# Patient Record
Sex: Male | Born: 2000 | Race: White | Hispanic: No | Marital: Single | Smoking: Never smoker
Health system: Southern US, Community
[De-identification: ages and names within clinical notes are randomized; demographics above are authoritative.]

## PROBLEM LIST (undated history)

## (undated) DIAGNOSIS — Z9109 Other allergy status, other than to drugs and biological substances: Secondary | ICD-10-CM

## (undated) DIAGNOSIS — J4540 Moderate persistent asthma, uncomplicated: Secondary | ICD-10-CM

## (undated) DIAGNOSIS — K219 Gastro-esophageal reflux disease without esophagitis: Secondary | ICD-10-CM

## (undated) DIAGNOSIS — T7840XA Allergy, unspecified, initial encounter: Secondary | ICD-10-CM

## (undated) DIAGNOSIS — F32A Depression, unspecified: Secondary | ICD-10-CM

## (undated) DIAGNOSIS — R51 Headache: Secondary | ICD-10-CM

## (undated) HISTORY — DX: Depression, unspecified: F32.A

## (undated) HISTORY — DX: Gastro-esophageal reflux disease without esophagitis: K21.9

## (undated) HISTORY — DX: Moderate persistent asthma, uncomplicated: J45.40

## (undated) HISTORY — PX: TYMPANOSTOMY: SHX2586

---
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
## 2019-05-07 ENCOUNTER — Other Ambulatory Visit: Payer: Self-pay

## 2019-05-07 ENCOUNTER — Encounter: Payer: Self-pay | Admitting: Allergy & Immunology

## 2019-05-07 ENCOUNTER — Ambulatory Visit (INDEPENDENT_AMBULATORY_CARE_PROVIDER_SITE_OTHER): Payer: BC Managed Care – PPO | Admitting: Allergy & Immunology

## 2019-05-07 VITALS — BP 120/70 | HR 80 | Temp 97.2°F | Resp 18 | Ht 68.0 in | Wt 179.8 lb

## 2019-05-07 DIAGNOSIS — J3089 Other allergic rhinitis: Secondary | ICD-10-CM | POA: Diagnosis not present

## 2019-05-07 DIAGNOSIS — J4540 Moderate persistent asthma, uncomplicated: Secondary | ICD-10-CM | POA: Diagnosis not present

## 2019-05-07 DIAGNOSIS — K219 Gastro-esophageal reflux disease without esophagitis: Secondary | ICD-10-CM

## 2019-05-07 DIAGNOSIS — Z91010 Allergy to peanuts: Secondary | ICD-10-CM | POA: Diagnosis not present

## 2019-05-07 DIAGNOSIS — J302 Other seasonal allergic rhinitis: Secondary | ICD-10-CM

## 2019-05-07 MED ORDER — ALBUTEROL SULFATE HFA 108 (90 BASE) MCG/ACT IN AERS: 2.0000 | INHALATION_SPRAY | RESPIRATORY_TRACT | 2 refills | Status: DC | PRN

## 2019-05-07 MED ORDER — OMEPRAZOLE 40 MG PO CPDR: 40.0000 mg | DELAYED_RELEASE_CAPSULE | Freq: Every day | ORAL | 5 refills | Status: DC

## 2019-05-07 MED ORDER — ADVAIR HFA 115-21 MCG/ACT IN AERO: 2.0000 | INHALATION_SPRAY | Freq: Two times a day (BID) | RESPIRATORY_TRACT | 5 refills | Status: DC

## 2019-05-07 NOTE — Progress Notes (Signed)
FOLLOW UP  Date of Service/Encounter:  05/07/19   Assessment:   Moderate persistent asthma, uncomplicated  Allergic rhinitis (grasses, weeds, trees, molds, dust mite, cat, dog, and horse)  Anaphylaxis to foods (peanuts, tree nuts)  Gastroesophageal reflux disease - on a PPI   Plan/Recommendations:   1. Moderate persistent asthma, uncomplicated - Lung function looked good today and it improved even more with albuterol treatment. - Start the prednisone dose pack provided to control inflammation. - We will continue with the Advair since this seems to be working well.  - Daily controller medication(s): Advair 115/1921mcg two puffs twice daily with spacer - Prior to physical activity: ProAir 2 puffs 10-15 minutes before physical activity. - Rescue medications: ProAir 4 puffs every 4-6 hours as needed - Asthma control goals:  * Full participation in all desired activities (may need albuterol before activity) * Albuterol use two time or less a week on average (not counting use with activity) * Cough interfering with sleep two time or less a month * Oral steroids no more than once a year * No hospitalizations  2. Allergic rhinitis - Consider taking nasal saline rinses daily to help prevent nose bleeds.   3. GERD - Continue with omeprazole 40mg  daily as needed.   4. Anaphylaxis to peanuts/tree nuts - EpiPen up to date. - Continue to avoid peanuts. - Could consider retesting at the next visit.   5. Return in about 6 months (around 11/07/2019). This can be an in-person, a virtual Webex or a telephone follow up visit.   Subjective:   Todd Garcia is a 18 y.o. male presenting today for follow up of  Chief Complaint  Patient presents with  . Asthma    Todd Garcia RoughHicks has a history of the following: Patient Active Problem List   Diagnosis Date Noted  . Moderate persistent asthma, uncomplicated 03/07/2018  . Seasonal and perennial allergic rhinitis 03/07/2018  . Sinus  infection 02/09/2016  . Asthma with acute exacerbation 01/13/2016  . Seasonal allergic conjunctivitis 01/13/2016  . Severe persistent asthma 05/29/2015  . Allergic rhinitis 05/29/2015  . GERD (gastroesophageal reflux disease) 05/29/2015    History obtained from: chart review and patient.  Todd Garcia is a 18 y.o. male presenting for a sick visit.  He was last seen in June 2019 for a well visit.  He tends to only come in during sick visits, however.  We did continue him on Breo 200/25 mcg 1 puff once daily.  We did provide him with additional samples at the last visit.  His lung function looked good. For his allergic rhinitis, we continued with montelukast 10 mg daily as well as Xyzal 5 mg daily.  We also continued fluticasone.  Since last visit, he has mostly done well. He comes today for asthma symptoms of two days duration. He has not been febrile at all during this time. He denies any COVID symptoms.   Asthma Symptom History: We had started him on Breo in the past but apparently he was changed to Advair at some point (the high dose MDI 230/21). He tells me that this is working fairly well, but he has been out of the medications for around six months.   Allergic Rhinitis Symptom History: He is no longer on the cetirizine or the nasal spray. He is no longer taking the montelukast either. He tells me that his symptoms overall have become much more manageable even without the use of medications. His last testing was performed in January 2009  and was positive to grasses, weeds, trees, molds, dust mite, cat, dog, and horse.   Food Allergy Symptom History: He continues to avoid peanuts and tree nuts. He has had no accidental ingestions. He does tell me that he has an up  to date EpiPen. He is not interested in retesting at this time.    GERD Symptom History: He does have omeprazole on his list although he tells me that he does not take it on a regular basis.   Otherwise, there have been no changes to  his past medical history, surgical history, family history, or social history.    Review of Systems  Constitutional: Negative.  Negative for fever, malaise/fatigue and weight loss.  HENT: Negative.  Negative for congestion, ear discharge and ear pain.   Eyes: Negative for pain, discharge and redness.  Respiratory: Positive for cough, shortness of breath and wheezing. Negative for sputum production.   Cardiovascular: Negative.  Negative for chest pain and palpitations.  Gastrointestinal: Negative for abdominal pain and heartburn.  Skin: Negative.  Negative for itching and rash.  Neurological: Negative for dizziness and headaches.  Endo/Heme/Allergies: Negative for environmental allergies. Does not bruise/bleed easily.       Objective:   Blood pressure 120/70, pulse 80, temperature (!) 97.2 F (36.2 C), resp. rate 18, height 5\' 8"  (1.727 m), weight 179 lb 12.8 oz (81.6 kg), SpO2 97 %. Body mass index is 27.34 kg/m.   Physical Exam:  Physical Exam  Constitutional: He appears well-developed.  Very pleasant male. Cooperative with the exam. Talking in complete sentences.   HENT:  Head: Normocephalic and atraumatic.  Right Ear: Tympanic membrane, external ear and ear canal normal.  Left Ear: Tympanic membrane, external ear and ear canal normal.  Nose: Mucosal edema present. No rhinorrhea, nasal deformity or septal deviation. Epistaxis is observed. Right sinus exhibits no maxillary sinus tenderness and no frontal sinus tenderness. Left sinus exhibits no maxillary sinus tenderness and no frontal sinus tenderness.  Mouth/Throat: Uvula is midline and oropharynx is clear and moist. Mucous membranes are not pale and not dry.  Eyes: Pupils are equal, round, and reactive to light. Conjunctivae and EOM are normal. Right eye exhibits no chemosis and no discharge. Left eye exhibits no chemosis and no discharge. Right conjunctiva is not injected. Left conjunctiva is not injected.  Cardiovascular:  Normal rate, regular rhythm and normal heart sounds.  Respiratory: Effort normal and breath sounds normal. No accessory muscle usage. No tachypnea. No respiratory distress. He has no wheezes. He has no rhonchi. He has no rales. He exhibits no tenderness.  Somewhat decreased air movement at the bases.   Lymphadenopathy:    He has no cervical adenopathy.  Neurological: He is alert.  Skin: No abrasion, no petechiae and no rash noted. Rash is not papular, not vesicular and not urticarial. No erythema. No pallor.  No eczematous or urticarial lesions noted.   Psychiatric: He has a normal mood and affect.     Diagnostic studies:    Spirometry: results normal (FEV1: 4.12/98%, FVC: 5.24/106%, FEV1/FVC: 79%).    Spirometry consistent with normal pattern. Albuterol 4 puffs treatment given in clinic with improvement in FEV1 and FVC, but not significant per ATS criteria.  Allergy Studies: none           Salvatore Marvel, MD  [facility]

## 2019-05-07 NOTE — Patient Instructions (Addendum)
1. Moderate persistent asthma, uncomplicated - Lung function looked good today and it improved even more with albuterol treatment. - Start the prednisone dose pack provided to control inflammation. - We will continue with the Advair since this seems to be working well.  - Daily controller medication(s): Advair 115/27mcg two puffs twice daily with spacer - Prior to physical activity: ProAir 2 puffs 10-15 minutes before physical activity. - Rescue medications: ProAir 4 puffs every 4-6 hours as needed - Asthma control goals:  * Full participation in all desired activities (may need albuterol before activity) * Albuterol use two time or less a week on average (not counting use with activity) * Cough interfering with sleep two time or less a month * Oral steroids no more than once a year * No hospitalizations  2. Allergic rhinitis - Consider taking nasal saline rinses daily to help prevent nose bleeds.   3. GERD - Continue with omeprazole 40mg  daily as needed.   4. Anaphylaxis to peanuts/tree nuts - EpiPen up to date. - Continue to avoid peanuts. - Could consider retesting at the next visit.   5. Return in about 6 months (around 11/07/2019). This can be an in-person, a virtual Webex or a telephone follow up visit.   Please inform us of any Emergency Department visits, hospitalizations, or changes in symptoms. Call us before going to the ED for breathing or allergy symptoms since we might be able to fit you in for a sick visit. Feel free to contact us anytime with any questions, problems, or concerns.  It was a pleasure to see you again today!  Websites that have reliable patient information: 1. American Academy of Asthma, Allergy, and Immunology: www.aaaai.org 2. Food Allergy Research and Education (FARE): foodallergy.org 3. Mothers of Asthmatics: http://www.asthmacommunitynetwork.org 4. American College of Allergy, Asthma, and Immunology: www.acaai.org  "Like" [place] on Facebook and  Instagram for our latest updates!      Make sure you are registered to vote! If you have moved or changed any of your contact information, you will need to get this updated before voting!  In some cases, you MAY be able to register to vote online: [email or web address]    Voter ID laws are NOT going into effect for the General Election in November 2020! DO NOT let this stop you from exercising your right to vote!   Absentee voting is the SAFEST way to vote during the coronavirus pandemic!   Download and print an absentee ballot request form at [email or web address] or you can scan the QR code below with your smart phone:      More information on absentee ballots can be found here: [email or web address]

## 2019-10-17 DIAGNOSIS — Z20828 Contact with and (suspected) exposure to other viral communicable diseases: Secondary | ICD-10-CM | POA: Diagnosis not present

## 2019-11-11 ENCOUNTER — Other Ambulatory Visit: Payer: Self-pay

## 2019-11-11 ENCOUNTER — Ambulatory Visit (INDEPENDENT_AMBULATORY_CARE_PROVIDER_SITE_OTHER): Payer: Self-pay | Admitting: Family Medicine

## 2019-11-11 ENCOUNTER — Encounter: Payer: Self-pay | Admitting: Family Medicine

## 2019-11-11 VITALS — BP 126/77 | HR 89 | Temp 99.3°F | Resp 16 | Ht 68.0 in | Wt 152.6 lb

## 2019-11-11 DIAGNOSIS — F332 Major depressive disorder, recurrent severe without psychotic features: Secondary | ICD-10-CM

## 2019-11-11 MED ORDER — FLUOXETINE HCL 20 MG PO TABS: 20.0000 mg | ORAL_TABLET | Freq: Every day | ORAL | 0 refills | Status: DC

## 2019-11-11 NOTE — Progress Notes (Signed)
Office Note 11/11/2019  CC:  Chief Complaint  Patient presents with  . Discuss ADHD    HPI:  Todd Garcia is a 19 y.o. male who is here to discuss ADHD. Working full time at [employer] in [place], but has interview with a [employer] soon. No college but contemplating.  Ongoing probs for about 3 yrs or so. Poor motivation, feels like he'll fail at everything, feels no joy from life, feels unwanted, can't focus or concentrate. He met a girl a few days ago and wants to be better for her.  His mom says he has caused all her problems. Todd Garcia is the product of an affair she had and she has made Todd Garcia feel insignificant b/c of this.  He sees his biologic father on weekends sometimes but not much. Has hx of MDD in the past, initially around 2012 when mom got divorced again.  He had to step up and be the man in the house and took criticism from mom.  Says his mom tries to control his life. Occ alcohol, occ use of LSD and marijuana. He admits to essentially stopping eating b/c of being obese: weighed 210 lbs 11/2018 and now weighs 152 lbs.  He admits to occ purging but denies any compulsion to binge/purge.  Lost lots of weight and says he is afraid to eat now b/c afraid of wt coming back, says he eats about ONCE per week, otherwise lives on sugary caffeinated drinks like Red Bull, Monster, Mt Dew, Coke, etc. Denies SI or HI but admits to cutting legs when younger. No hx of being on psych meds or psych hospitalization in the past. Has no friends for support, no family members that support him.   ROS: no fevers, no hearing or vision c/o, no abd pain, no n/v/d, no CP, no SOB, no wheezing, no cough, no dizziness, no HAs, no rashes, no melena/hematochezia.  No polyuria or polydipsia.  No myalgias or arthralgias.  Past Medical History:  Diagnosis Date  . Environmental allergies   . GERD (gastroesophageal reflux disease)   . Moderate persistent asthma    [facility]    Past Surgical History:  Procedure Laterality Date  . ADENOIDECTOMY  09/21/2011   Procedure: ADENOIDECTOMY;  Surgeon: Cecil Cranker;  Location: [facility] OR;  Service: ENT;  Laterality: N/A;  . TYMPANOSTOMY      No family history on file.  Social History   Socioeconomic History  . Marital status: Single    Spouse name: Not on file  . Number of children: Not on file  . Years of education: Not on file  . Highest education level: Not on file  Occupational History  . Not on file  Tobacco Use  . Smoking status: Never Smoker  . Smokeless tobacco: Never Used  Substance and Sexual Activity  . Alcohol use: No  . Drug use: No  . Sexual activity: Not on file  Other Topics Concern  . Not on file  Social History Narrative   As of 10/2019 he lives in [place] with his mom and 2 half brothers.   [employer] grad.   Social Determinants of Health   Financial Resource Strain:   . Difficulty of Paying Living Expenses: Not on file  Food Insecurity:   . Worried About Charity fundraiser in the Last Year: Not on file  . Ran Out of Food in the Last Year: Not on file  Transportation Needs:   .  Lack of Transportation (Medical): Not on file  . Lack of Transportation (Non-Medical): Not on file  Physical Activity:   . Days of Exercise per Week: Not on file  . Minutes of Exercise per Session: Not on file  Stress:   . Feeling of Stress : Not on file  Social Connections:   . Frequency of Communication with Friends and Family: Not on file  . Frequency of Social Gatherings with Friends and Family: Not on file  . Attends Religious Services: Not on file  . Active Member of Clubs or Organizations: Not on file  . Attends Archivist Meetings: Not on file  . Marital Status: Not on file  Intimate Partner Violence:   . Fear of Current or Ex-Partner: Not on file  . Emotionally Abused: Not on file  . Physically Abused: Not on file  . Sexually Abused: Not on file    Outpatient Medications  Prior to Visit  Medication Sig Dispense Refill  . albuterol (PROVENTIL) (2.5 MG/3ML) 0.083% nebulizer solution Take 3 mLs (2.5 mg total) by nebulization every 6 (six) hours as needed. For shortness of breath (Patient not taking: Reported on 05/07/2019) 75 mL 0  . albuterol (VENTOLIN HFA) 108 (90 Base) MCG/ACT inhaler Inhale 2 puffs into the lungs every 4 (four) hours as needed for wheezing or shortness of breath. (Patient not taking: Reported on 11/11/2019) 18 g 2  . diphenhydrAMINE (BENADRYL) 25 mg capsule Take 25 mg by mouth every 4 (four) hours as needed.    [name] EPINEPHrine (EPIPEN 2-PAK) 0.3 mg/0.3 mL IJ SOAJ injection Inject 0.3 mg into the muscle once.    . fluticasone-salmeterol (ADVAIR HFA) 115-21 MCG/ACT inhaler Inhale 2 puffs into the lungs 2 (two) times daily. (Patient not taking: Reported on 11/11/2019) 1 Inhaler 5  . levocetirizine (XYZAL) 5 MG tablet Take 1 tablet (5 mg total) by mouth every evening. (Patient not taking: Reported on 05/07/2019) 30 tablet 5  . montelukast (SINGULAIR) 10 MG tablet Take 1 tablet (10 mg total) by mouth at bedtime. (Patient not taking: Reported on 05/07/2019) 30 tablet 5  . omeprazole (PRILOSEC) 40 MG capsule Take 1 capsule (40 mg total) by mouth daily. (Patient not taking: Reported on 11/11/2019) 30 capsule 5   No facility-administered medications prior to visit.    Allergies  Allergen Reactions  . Molds & Smuts Shortness Of Breath    Inflames asthma, allergy shots had to be stopped   . Peanut-Containing Drug Products Anaphylaxis  . Pollen Extract Shortness Of Breath    Inflames asthma, allergy shots had to be stopped  . Other     All tree nuts    ROS See HPI PE; Blood pressure 126/77, pulse 89, temperature 99.3 F (37.4 C), temperature source Temporal, resp. rate 16, height '5\' 8"'$  (1.727 m), weight 152 lb 9.6 oz (69.2 kg), SpO2 99 %.  Wt Readings from Last 2 Encounters:  11/11/19 152 lb 9.6 oz (69.2 kg) (48 %, Z= -0.06)*  05/07/19 179 lb 12.8 oz  (81.6 kg) (83 %, Z= 0.96)*   * Growth percentiles are based on CDC (Boys, 2-20 Years) data.    Gen: alert, oriented x 4, affect is flat, hair is disheveled.  Lucid thinking and conversation noted. HEENT: PERRLA, EOMI.   Neck: no LAD, mass, or thyromegaly. CV: RRR, no m/r/g LUNGS: CTA bilat, nonlabored. NEURO: no tremor or tics noted on observation.  Coordination intact. CN 2-12 grossly intact bilaterally, strength 5/5 in all extremeties.  No ataxia.  Pertinent labs:  none   ASSESSMENT AND PLAN:   MDD, recurrent, severe but w/out psychotic features. Start fluoxetine '20mg'$  qd.  Therapeutic expectations and side effect profile of medication discussed today.  Patient's questions answered. Will get counseling started soon.  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 Will work slowly on getting this better.   Start with trying to eat one small meal per day and limit caffeinated drinks to 2 per day.  Will get baseline CBC, CMET, and TSH at next visit.  An After Visit Summary was printed and given to the patient.  FOLLOW UP:  Return in about 1 week (around 11/18/2019) for f/u mood.  Signed:  Crissie Sickles, MD           11/11/2019

## 2019-11-19 ENCOUNTER — Other Ambulatory Visit: Payer: Self-pay

## 2019-11-19 ENCOUNTER — Encounter: Payer: Self-pay | Admitting: Family Medicine

## 2019-11-19 ENCOUNTER — Ambulatory Visit (INDEPENDENT_AMBULATORY_CARE_PROVIDER_SITE_OTHER): Payer: BC Managed Care – PPO | Admitting: Family Medicine

## 2019-11-19 VITALS — BP 119/75 | HR 66 | Temp 98.7°F | Resp 16 | Ht 68.0 in | Wt 154.2 lb

## 2019-11-19 DIAGNOSIS — F332 Major depressive disorder, recurrent severe without psychotic features: Secondary | ICD-10-CM | POA: Diagnosis not present

## 2019-11-19 MED ORDER — FLUOXETINE HCL 20 MG PO TABS: 20.0000 mg | ORAL_TABLET | Freq: Every day | ORAL | 0 refills | Status: DC

## 2019-11-19 NOTE — Progress Notes (Signed)
OFFICE VISIT  11/19/2019   CC:  Chief Complaint  Patient presents with  . Follow-up    mood   HPI:    Patient is a 19 y.o. Caucasian male who presents for 1 wk f/u MDD. A/P as of last visit (my first visit with him). "MDD, recurrent, severe but w/out psychotic features. Start fluoxetine 20mg  qd.  Therapeutic expectations and side effect profile of medication discussed today.  Patient's questions answered. Will get counseling started soon.  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 Will work slowly on getting this better.   Start with trying to eat one small meal per day and limit caffeinated drinks to 2 per day.  Will get baseline CBC, CMET, and TSH at next visit."  Interim hx: Feeling much improved. Some sedation on fluox but tolerable. Marked increase in appetite, eating 1-2 meals per day now.  Cutting back a lot on caffeine/sugary drinks, much more water intake.  Sleep hygiene improved, now sleeping approx midnight to 7 am well. He does not think he would do well with counseling, doesn't like talking about his probs with others. Continues to work, no problems there. Says problems at home seem to roll off him better the last week.  Past Medical History:  Diagnosis Date  . Environmental allergies   . GERD (gastroesophageal reflux disease)   . Moderate persistent asthma    [facility]    Past Surgical History:  Procedure Laterality Date  . ADENOIDECTOMY  09/21/2011   Procedure: ADENOIDECTOMY;  Surgeon: 09/23/2011;  Location: [facility] OR;  Service: ENT;  Laterality: N/A;  . TYMPANOSTOMY      Outpatient Medications Prior to Visit  Medication Sig Dispense Refill  . FLUoxetine (PROZAC) 20 MG tablet Take 1 tablet (20 mg total) by mouth daily. 14 tablet 0  . albuterol (PROVENTIL) (2.5 MG/3ML) 0.083% nebulizer solution Take 3 mLs (2.5 mg total) by nebulization every 6 (six) hours as needed. For shortness of  breath (Patient not taking: Reported on 05/07/2019) 75 mL 0  . albuterol (VENTOLIN HFA) 108 (90 Base) MCG/ACT inhaler Inhale 2 puffs into the lungs every 4 (four) hours as needed for wheezing or shortness of breath. (Patient not taking: Reported on 11/11/2019) 18 g 2  . diphenhydrAMINE (BENADRYL) 25 mg capsule Take 25 mg by mouth every 4 (four) hours as needed.    11/13/2019 EPINEPHrine (EPIPEN 2-PAK) 0.3 mg/0.3 mL IJ SOAJ injection Inject 0.3 mg into the muscle once.    . fluticasone-salmeterol (ADVAIR HFA) 115-21 MCG/ACT inhaler Inhale 2 puffs into the lungs 2 (two) times daily. (Patient not taking: Reported on 11/11/2019) 1 Inhaler 5  . levocetirizine (XYZAL) 5 MG tablet Take 1 tablet (5 mg total) by mouth every evening. (Patient not taking: Reported on 05/07/2019) 30 tablet 5  . montelukast (SINGULAIR) 10 MG tablet Take 1 tablet (10 mg total) by mouth at bedtime. (Patient not taking: Reported on 05/07/2019) 30 tablet 5  . omeprazole (PRILOSEC) 40 MG capsule Take 1 capsule (40 mg total) by mouth daily. (Patient not taking: Reported on 11/11/2019) 30 capsule 5   No facility-administered medications prior to visit.    Allergies  Allergen Reactions  . Molds & Smuts Shortness Of Breath    Inflames asthma, allergy shots had to be stopped   . Peanut-Containing Drug Products Anaphylaxis  . Pollen Extract Shortness Of Breath    Inflames asthma, allergy shots had to be stopped  .  Other     All tree nuts    ROS As per HPI  PE: Blood pressure 119/75, pulse 66, temperature 98.7 F (37.1 C), temperature source Temporal, resp. rate 16, height 5\' 8"  (1.727 m), weight 154 lb 3.2 oz (69.9 kg), SpO2 99 %. Gen: Alert, well appearing.  Patient is oriented to person, place, time, and situation. AFFECT: pleasant, lucid thought and speech. No further exam today.  LABS:  none  IMPRESSION AND PLAN:  MDD, recurrent. Doing well over the last week, tolerating fluoxetine fine even though it makes him tired  currently. We'll see if this effect gradually subsides over the next 3 wks. He is pleased with how things have started off since last week's visit. I think his dietary changes and sleep hygiene changes have made most of the difference. He declines counseling at this time.  His wt is up 2 lbs, which is good.  He is a little apprehensive about gaining all his wt back to when he was very overweight/obese, but he plans on making good food choices. Appetite increase that he has had over the last week may calm down over the next few weeks.  An After Visit Summary was printed and given to the patient.  FOLLOW UP: Return in about 3 weeks (around 12/10/2019) for f/u Dep/anx.  Signed:  Crissie Sickles, MD           11/19/2019

## 2019-11-22 DIAGNOSIS — Z03818 Encounter for observation for suspected exposure to other biological agents ruled out: Secondary | ICD-10-CM | POA: Diagnosis not present

## 2019-11-22 DIAGNOSIS — J020 Streptococcal pharyngitis: Secondary | ICD-10-CM | POA: Diagnosis not present

## 2019-12-10 ENCOUNTER — Ambulatory Visit (INDEPENDENT_AMBULATORY_CARE_PROVIDER_SITE_OTHER): Payer: BC Managed Care – PPO | Admitting: Family Medicine

## 2019-12-10 ENCOUNTER — Other Ambulatory Visit: Payer: Self-pay

## 2019-12-10 ENCOUNTER — Encounter: Payer: Self-pay | Admitting: Family Medicine

## 2019-12-10 VITALS — BP 103/66 | HR 93 | Temp 98.6°F | Resp 16 | Ht 68.0 in | Wt 146.8 lb

## 2019-12-10 DIAGNOSIS — F332 Major depressive disorder, recurrent severe without psychotic features: Secondary | ICD-10-CM | POA: Diagnosis not present

## 2019-12-10 DIAGNOSIS — F419 Anxiety disorder, unspecified: Secondary | ICD-10-CM | POA: Diagnosis not present

## 2019-12-10 MED ORDER — FLUOXETINE HCL 20 MG PO TABS: 20.0000 mg | ORAL_TABLET | Freq: Every day | ORAL | 0 refills | Status: AC

## 2019-12-10 NOTE — Progress Notes (Signed)
OFFICE VISIT  12/10/2019   CC:  Chief Complaint  Patient presents with  . Follow-up    depression/anxiety   HPI:    Patient is a 19 y.o.  male who presents for 3 wk f/u depression and anxiety. A/P as of last visit: "MDD, recurrent. Doing well over the last week, tolerating fluoxetine fine even though it makes him tired currently. We'll see if this effect gradually subsides over the next 3 wks. He is pleased with how things have started off since last week's visit. I think his dietary changes and sleep hygiene changes have made most of the difference. He declines counseling at this time.  His wt is up 2 lbs, which is good.  He is a little apprehensive about gaining all his wt back to when he was very overweight/obese, but he plans on making good food choices. Appetite increase that he has had over the last week may calm down over the next few weeks."  Interim hx: Mood and anxiety levels continue to improve.  Overall feels 20-30% improvement but admits this is hard to put a number on b/c of ongoing life problems playing a role in daily life still.  He finds himself adjusting better to these types of things, though.  No SI or HI or abnl eating behaviors.  Sleep is good. No more feeling of fatigue since last visit. Appetite good. Yesterday he at orange [employer] and pasta. Two grilled chicken wraps. He [employer] at park and in [place] neighborhood. His wt is down 7 lbs since I last saw him and he is not worried about this at all.  Past Medical History:  Diagnosis Date  . Environmental allergies   . GERD (gastroesophageal reflux disease)   . Moderate persistent asthma    [facility]    Past Surgical History:  Procedure Laterality Date  . ADENOIDECTOMY  09/21/2011   Procedure: ADENOIDECTOMY;  Surgeon: Leonette Most;  Location: [facility] OR;  Service: ENT;  Laterality: N/A;  . TYMPANOSTOMY      Outpatient Medications Prior to Visit  Medication Sig Dispense Refill   . albuterol (PROVENTIL) (2.5 MG/3ML) 0.083% nebulizer solution Take 3 mLs (2.5 mg total) by nebulization every 6 (six) hours as needed. For shortness of breath 75 mL 0  . albuterol (VENTOLIN HFA) 108 (90 Base) MCG/ACT inhaler Inhale 2 puffs into the lungs every 4 (four) hours as needed for wheezing or shortness of breath. 18 g 2  . FLUoxetine (PROZAC) 20 MG tablet Take 1 tablet (20 mg total) by mouth daily. 30 tablet 0  . diphenhydrAMINE (BENADRYL) 25 mg capsule Take 25 mg by mouth every 4 (four) hours as needed.    [name] EPINEPHrine (EPIPEN 2-PAK) 0.3 mg/0.3 mL IJ SOAJ injection Inject 0.3 mg into the muscle once.    . fluticasone-salmeterol (ADVAIR HFA) 115-21 MCG/ACT inhaler Inhale 2 puffs into the lungs 2 (two) times daily. (Patient not taking: Reported on 12/10/2019) 1 Inhaler 5  . levocetirizine (XYZAL) 5 MG tablet Take 1 tablet (5 mg total) by mouth every evening. (Patient not taking: Reported on 05/07/2019) 30 tablet 5  . montelukast (SINGULAIR) 10 MG tablet Take 1 tablet (10 mg total) by mouth at bedtime. (Patient not taking: Reported on 05/07/2019) 30 tablet 5  . omeprazole (PRILOSEC) 40 MG capsule Take 1 capsule (40 mg total) by mouth daily. (Patient not taking: Reported on 11/11/2019) 30 capsule 5   No facility-administered medications prior to visit.    Allergies  Allergen Reactions  . Molds & Smuts Shortness Of Breath    Inflames asthma, allergy shots had to be stopped   . Peanut-Containing Drug Products Anaphylaxis  . Pollen Extract Shortness Of Breath    Inflames asthma, allergy shots had to be stopped  . Other     All tree nuts    ROS As per HPI  PE: Blood pressure 103/66, pulse 93, temperature 98.6 F (37 C), temperature source Temporal, resp. rate 16, height 5\' 8"  (1.727 m), weight 146 lb 12.8 oz (66.6 kg), SpO2 98 %. Gen: Alert, well appearing.  Patient is oriented to person, place, time, and situation. AFFECT: pleasant, lucid thought and speech. No further exam  today.  LABS:  none   IMPRESSION AND PLAN:  MDD, recurrent, severe w/out psychosis-->in partial remission on just 1 mo of fluoxetine 20mg  qd. Decided to stay with current med dosing at this time and f/u again in 1 mo. Signs/symptoms to call or return or seek emergency care for were reviewed and pt expressed understanding.  An After Visit Summary was printed and given to the patient.  FOLLOW UP: Return in about 4 weeks (around 01/07/2020) for f/u mood/anx.  Signed:  Crissie Sickles, MD           12/10/2019

## 2019-12-15 ENCOUNTER — Other Ambulatory Visit: Payer: Self-pay

## 2019-12-15 ENCOUNTER — Ambulatory Visit (INDEPENDENT_AMBULATORY_CARE_PROVIDER_SITE_OTHER): Payer: BC Managed Care – PPO | Admitting: Family Medicine

## 2019-12-15 ENCOUNTER — Encounter: Payer: Self-pay | Admitting: Family Medicine

## 2019-12-15 ENCOUNTER — Emergency Department (HOSPITAL_COMMUNITY)
Admission: EM | Admit: 2019-12-15 | Discharge: 2019-12-15 | Disposition: A | Discharge status: Home / Self Care | Payer: BC Managed Care – PPO | Attending: Emergency Medicine | Admitting: Emergency Medicine

## 2019-12-15 VITALS — BP 116/76 | HR 70 | Temp 98.4°F | Resp 16 | Ht 68.0 in | Wt 146.8 lb

## 2019-12-15 DIAGNOSIS — F333 Major depressive disorder, recurrent, severe with psychotic symptoms: Secondary | ICD-10-CM

## 2019-12-15 DIAGNOSIS — F1210 Cannabis abuse, uncomplicated: Secondary | ICD-10-CM | POA: Diagnosis not present

## 2019-12-15 DIAGNOSIS — Z20822 Contact with and (suspected) exposure to covid-19: Secondary | ICD-10-CM | POA: Diagnosis not present

## 2019-12-15 DIAGNOSIS — Z91010 Allergy to peanuts: Secondary | ICD-10-CM | POA: Insufficient documentation

## 2019-12-15 DIAGNOSIS — R45851 Suicidal ideations: Secondary | ICD-10-CM | POA: Diagnosis not present

## 2019-12-15 DIAGNOSIS — Z03818 Encounter for observation for suspected exposure to other biological agents ruled out: Secondary | ICD-10-CM | POA: Diagnosis not present

## 2019-12-15 DIAGNOSIS — F332 Major depressive disorder, recurrent severe without psychotic features: Secondary | ICD-10-CM | POA: Diagnosis not present

## 2019-12-15 DIAGNOSIS — Z046 Encounter for general psychiatric examination, requested by authority: Secondary | ICD-10-CM | POA: Diagnosis not present

## 2019-12-15 LAB — BASIC METABOLIC PANEL
Anion gap: 12 (ref 5–15)
BUN: 7 mg/dL (ref 6–20)
CO2: 25 mmol/L (ref 22–32)
Calcium: 9.4 mg/dL (ref 8.9–10.3)
Chloride: 103 mmol/L (ref 98–111)
Creatinine, Ser: 0.78 mg/dL (ref 0.61–1.24)
GFR calc Af Amer: >60 mL/min (ref >60)
GFR calc non Af Amer: >60 mL/min (ref >60)
Glucose, Bld: 97 mg/dL (ref 70–99)
Potassium: 3.7 mmol/L (ref 3.5–5.1)
Sodium: 140 mmol/L (ref 135–145)

## 2019-12-15 LAB — CBC WITH DIFFERENTIAL/PLATELET
Abs Immature Granulocytes: 0.02 10*3/uL (ref 0.00–0.07)
Basophils Absolute: 0 10*3/uL (ref 0.0–0.1)
Basophils Relative: 1 %
Eosinophils Absolute: 0.1 10*3/uL (ref 0.0–0.5)
Eosinophils Relative: 2 %
HCT: 41.6 % (ref 39.0–52.0)
Hemoglobin: 14.5 g/dL (ref 13.0–17.0)
Immature Granulocytes: 0 %
Lymphocytes Relative: 20 %
Lymphs Abs: 1.6 10*3/uL (ref 0.7–4.0)
MCH: 30.8 pg (ref 26.0–34.0)
MCHC: 34.9 g/dL (ref 30.0–36.0)
MCV: 88.3 fL (ref 80.0–100.0)
Monocytes Absolute: 0.4 10*3/uL (ref 0.1–1.0)
Monocytes Relative: 6 %
Neutro Abs: 5.7 10*3/uL (ref 1.7–7.7)
Neutrophils Relative %: 71 %
Platelets: 324 10*3/uL (ref 150–400)
RBC: 4.71 MIL/uL (ref 4.22–5.81)
RDW: 13.2 % (ref 11.5–15.5)
WBC: 7.9 10*3/uL (ref 4.0–10.5)
nRBC: 0 % (ref 0.0–0.2)

## 2019-12-15 LAB — ETHANOL: Alcohol, Ethyl (B): <10 mg/dL (ref <10)

## 2019-12-15 LAB — RAPID URINE DRUG SCREEN, HOSP PERFORMED
Amphetamines: NOT DETECTED
Barbiturates: NOT DETECTED
Benzodiazepines: NOT DETECTED
Cocaine: NOT DETECTED
Opiates: NOT DETECTED
Tetrahydrocannabinol: POSITIVE — AB

## 2019-12-15 LAB — RESPIRATORY PANEL BY RT PCR (FLU A&B, COVID)
Influenza A by PCR: NEGATIVE
Influenza B by PCR: NEGATIVE
SARS Coronavirus 2 by RT PCR: NEGATIVE

## 2019-12-15 MED ORDER — ALBUTEROL SULFATE (2.5 MG/3ML) 0.083% IN NEBU: 2.5000 mg | INHALATION_SOLUTION | Freq: Four times a day (QID) | RESPIRATORY_TRACT | Status: DC | PRN

## 2019-12-15 MED ORDER — FLUOXETINE HCL 20 MG PO CAPS: 20.0000 mg | ORAL_CAPSULE | Freq: Every day | ORAL | Status: DC

## 2019-12-15 NOTE — Progress Notes (Signed)
See my note ([place]) below. Signed:  Santiago Bumpers, MD           12/15/2019   [email or web address] Bethel Born - manager of [employer]

## 2019-12-15 NOTE — Discharge Instructions (Addendum)
As discussed, you have contracted for safety.  Please be sure to speak with either your physician or our affiliated behavioral health colleagues tomorrow to ensure appropriate ongoing care.  If you develop new, or or concerning changes, please do not hesitate to return here immediately for additional evaluation.

## 2019-12-15 NOTE — ED Notes (Signed)
Patient's grandmother showed up to facility but decision was made to refuse visitation as patient is under 1:1 observation for SI and family dynamics can increase the stress. Patient asked to speak to the doctor about being discharged. Sent Dr. Jeraldine Loots a note and explained to patient why he was here, for his own safety. Also explained there may be a wait for the doctor due to emergencies potentially being handled out in the main ER.

## 2019-12-15 NOTE — BH Assessment (Addendum)
Assessment Note  Todd Garcia is an 19 y.o. male that presents this date with S/I. Patient renders conflicting history denying any plan at the time of assessment although per note review reported to his PCP who he saw earlier this date that he had a plan to go into anaphylactic shock by "eating something he is allergic too." Patient denies any prior attempts or gestures at self harm. Patient states he was diagnosed with depression one month ago and is currently receiving OP services from a OP provider at [facility]. See Epic note from [facility] MD as of 12/10/19. Patient states he is currently prescribed Prozac 20 mg that he has been taking daily for the last month. Patient states he feels he is tolerating that medication well and reports current medication compliance. Patient states he still has "up and down moods" reporting periodic bouts of depression where he feels "useless sometimes." Patient's mood is inconsistent with thought content as patient is observed to be talking about taking his life and smiling /laughing at times. Patient states he "loves high risk behaviors" to include driving fast and "sewing his fingers together just to have something to do." Patient states he uses a needle and thread to connect his fingers two to three times a month when he "has nothing else to do." Patient denies any previous attempts or gestures at self harm. Patient denies currently having a psychiatrist or therapist stating "he doesn't need one." As noted patient's PCP is currently writing for his medication. Patient reports current SA issues to include using THC (in vape form) daily stating he uses "one cartridge a week." Patient denies any other SA use with UDS pending. Patient reports he currently resides with his mother and is employed at [employer]. Patient states he recently dropped out of college because he "wasn't smart enough". Patient reports current stressors to include: disappointment over a romantic  relationship he was trying to pursue and "not having a life."  Patient presented earlier this date at his provider for a annual physical and reported S/I. Per that note from [facility] MD. "White male who is here for annual health maintenance exam and for 5d  f/u recurrent severe MDD. 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 More psych stress: a girl likes him but she has a boyfriend and two children. He is confused, not knowing what to do. The other girl he had been talking to tried to commit suicide, now in mental hospital. He wishes he could have done more.    Has been feeling nauseated since all this happened, some trouble breathing through mask, dizziness. No palpitations. Appetite getting better last 2d. Some salad and mini corn dogs. Drinking fluids fine. Has diarrhea "constantly". Then says he only has bm 2 times per week. Stomach shaky last few days.  He last ate 17 hrs ago, mini corn dogs. Smoking marijuana some lately has helped. Also did acid 1 wk ago, says it helps him think and get perspective and make sense of things for him. Says he "microdoses" it. Denies any physical abuse. His mom is currently in .  At end of interview I asked if he had suicidal or homicidal thoughts. Said no homicidal but definitely suicidal thoughts. He said he has a plan. He says he intends to eat something he is allergic to and try to induce anaphylactic shock. I asked if he felt like doing this plan now he said "  Yes".  Patient is oriented x 4 and speaks in normal tone and volume. Thought process is organized and patient does not appear  To be responding to internal stimuli. Case was staffed with [employer] who recommended patient be observed and monitored.    Diagnosis: F33.2 MDD recurrent without psychotic features, severe, Cannabis use  Past Medical History:  Past Medical History:  Diagnosis Date  . Environmental allergies    . GERD (gastroesophageal reflux disease)   . Moderate persistent asthma    [facility]    Past Surgical History:  Procedure Laterality Date  . ADENOIDECTOMY  09/21/2011   Procedure: ADENOIDECTOMY;  Surgeon: Cecil Cranker;  Location: [facility] OR;  Service: ENT;  Laterality: N/A;  . TYMPANOSTOMY      Family History: No family history on file.  Social History:  reports that he has never smoked. He has never used smokeless tobacco. He reports that he does not drink alcohol or use drugs.  Additional Social History:  Alcohol / Drug Use Pain Medications: See MAR Prescriptions: See MAR Over the Counter: See MAR History of alcohol / drug use?: Yes Longest period of sobriety (when/how long): Unknown Negative Consequences of Use: (Denies) Withdrawal Symptoms: (Denies) Substance #1 Name of Substance 1: Cannabis 1 - Age of First Use: 17 1 - Amount (size/oz): Varies 1 - Frequency: Varies 1 - Duration: Ongoing 1 - Last Use / Amount: 12/14/19 Vape pen  CIWA: CIWA-Ar BP: (!) 143/99(RN notified) Pulse Rate: 70 COWS:    Allergies:  Allergies  Allergen Reactions  . Molds & Smuts Shortness Of Breath    Inflames asthma, allergy shots had to be stopped   . Peanut-Containing Drug Products Anaphylaxis  . Pollen Extract Shortness Of Breath    Inflames asthma, allergy shots had to be stopped  . Other     All tree nuts    Home Medications: (Not in a hospital admission)   OB/GYN Status:  No LMP for male patient.  General Assessment Data Location of Assessment: [facility] TTS Assessment: In system Is this a Tele or Face-to-Face Assessment?: Face-to-Face Is this an Initial Assessment or a Re-assessment for this encounter?: Initial Assessment Patient Accompanied by:: N/A Language Other than English: No Living Arrangements: Other (Comment) What gender do you identify as?: Male Marital status: Single Living Arrangements: Parent Can pt return to current living arrangement?:  Yes Admission Status: Voluntary Is patient capable of signing voluntary admission?: Yes Referral Source: MD Insurance type: [place] Screening Exam ([place]) Medical Exam completed: Yes  Crisis Care Plan Living Arrangements: Parent Legal Guardian: (NA) Name of Psychiatrist: None Name of Therapist: None  Education Status Is patient currently in school?: No Is the patient employed, unemployed or receiving disability?: Employed  Risk to self with the past 6 months Suicidal Ideation: Yes-Currently Present Has patient been a risk to self within the past 6 months prior to admission? : Yes Suicidal Intent: No Has patient had any suicidal intent within the past 6 months prior to admission? : No Is patient at risk for suicide?: Yes Suicidal Plan?: No Has patient had any suicidal plan within the past 6 months prior to admission? : No Access to Means: No What has been your use of drugs/alcohol within the last 12 months?: Current use Previous Attempts/Gestures: No How many times?: 0 Other Self Harm Risks: (NA) Triggers for Past Attempts: (NA) Intentional Self Injurious Behavior: Damaging Comment - Self Injurious Behavior: Pt "sews his fingers  together" Family Suicide History: No Recent stressful life event(s): Other (Comment)(Relationship issues) Persecutory voices/beliefs?: No Depression: Yes Depression Symptoms: Feeling worthless/self pity Substance abuse history and/or treatment for substance abuse?: No Suicide prevention information given to non-admitted patients: Not applicable  Risk to Others within the past 6 months Homicidal Ideation: No Does patient have any lifetime risk of violence toward others beyond the six months prior to admission? : No Thoughts of Harm to Others: No Current Homicidal Intent: No Current Homicidal Plan: No Access to Homicidal Means: No Identified Victim: NA History of harm to others?: No Assessment of Violence: None Noted Violent  Behavior Description: (NA) Does patient have access to weapons?: No Criminal Charges Pending?: No Does patient have a court date: No Is patient on probation?: No  Psychosis Hallucinations: None noted Delusions: None noted  Mental Status Report Appearance/Hygiene: In scrubs Eye Contact: Good Motor Activity: Freedom of movement Speech: Logical/coherent Level of Consciousness: Alert Mood: Pleasant Affect: Inconsistent with thought content Anxiety Level: Minimal Thought Processes: Coherent, Relevant Judgement: Partial Orientation: Person, Place, Time Obsessive Compulsive Thoughts/Behaviors: None  Cognitive Functioning Concentration: Normal Memory: Recent Intact, Remote Intact Is patient IDD: No Insight: Fair Impulse Control: Fair Appetite: Good Have you had any weight changes? : No Change Sleep: No Change Total Hours of Sleep: 4 Vegetative Symptoms: None  ADLScreening [facility] Assessment Services) Patient's cognitive ability adequate to safely complete daily activities?: Yes Patient able to express need for assistance with ADLs?: Yes Independently performs ADLs?: Yes (appropriate for developmental age)  Prior Inpatient Therapy Prior Inpatient Therapy: No  Prior Outpatient Therapy Prior Outpatient Therapy: Yes Prior Therapy Dates: Ongoing Prior Therapy Facilty/Provider(s): [place] [place] Reason for Treatment: Med mang Does patient have an ACCT team?: No Does patient have Intensive In-House Services?  : No Does patient have [facility] services? : No Does patient have P4CC services?: No  ADL Screening (condition at time of admission) Patient's cognitive ability adequate to safely complete daily activities?: Yes Is the patient deaf or have difficulty hearing?: No Does the patient have difficulty seeing, even when wearing glasses/contacts?: No Does the patient have difficulty concentrating, remembering, or making decisions?: No Patient able to express need for assistance with  ADLs?: Yes Does the patient have difficulty dressing or bathing?: No Independently performs ADLs?: Yes (appropriate for developmental age) Does the patient have difficulty walking or climbing stairs?: No Weakness of Legs: None Weakness of Arms/Hands: None  Home Assistive Devices/Equipment Home Assistive Devices/Equipment: None  Therapy Consults (therapy consults require a physician order) PT Evaluation Needed: No OT Evalulation Needed: No SLP Evaluation Needed: No Abuse/Neglect Assessment (Assessment to be complete while patient is alone) Abuse/Neglect Assessment Can Be Completed: Yes Physical Abuse: Denies Verbal Abuse: Denies Sexual Abuse: Denies Exploitation of patient/patient's resources: Denies Self-Neglect: Denies Values / Beliefs Cultural Requests During Hospitalization: None Spiritual Requests During Hospitalization: None Consults Spiritual Care Consult Needed: No Transition of Care Team Consult Needed: No Advance Directives (For Healthcare) Does Patient Have a Medical Advance Directive?: No Would patient like information on creating a medical advance directive?: No - Patient declined          Disposition: Case was staffed with [facility] who recommended patient be observed and monitored.   Disposition Initial Assessment Completed for this Encounter: Yes  On Site Evaluation by:   Reviewed with Physician:    Alfredia Ferguson 12/15/2019 4:17 PM

## 2019-12-15 NOTE — ED Notes (Signed)
Pt has 1 bag of belongings in locker 28.

## 2019-12-15 NOTE — ED Notes (Signed)
Dr. Jeraldine Loots asked that we call the patient's grandmother to have her come back and participate in the conversation with patient to contract for safety as he seems appropriate at this time. Grandmother is on her way. Will conference when she arrives.

## 2019-12-15 NOTE — BH Assessment (Signed)
[facility] Assessment Progress Note   Case was staffed with [facility] who recommended patient be observed and monitored.

## 2019-12-15 NOTE — ED Triage Notes (Signed)
Pt brought in [facility] by [facility]. Pt voluntary. Pt having SI thoughts. Pt has hx of SI and major depression.

## 2019-12-15 NOTE — ED Notes (Signed)
Patient contracted for safety. Altercation with mother so grandmother is going to take patient home with her and decision will be made as to future living arrangements that will be conducive to stress reduction. Patient's grandmother is familiar with [facility] and will call them in the morning. Spoke with patient about utilizing different providers if connection is not made that allows the patient to express himself with counselor. Patient understood and verbalized.

## 2019-12-15 NOTE — ED Provider Notes (Signed)
[place] DEPT Provider Note   CSN: [id number] Arrival date & time: 12/15/19  1434     History Chief Complaint  Patient presents with  . Medical Clearance    SI    Todd Garcia is a 19 y.o. male.  HPI    10:22 PM Patient presents with concern of suicidal ideation.  He denies physical complaints, but today was at his 67 office having an annual physical.  He related to his physician that he was having suicidal thoughts and was sent here for evaluation.  He notes that this has been going on for about 4 days.  He has had them previously, but has not previously been evaluated by psychiatrist nor hospitalized. He notes a history of asthma, takes albuterol as needed.  He also started Prozac 1 month ago. Past Medical History:  Diagnosis Date  . Environmental allergies   . GERD (gastroesophageal reflux disease)   . Moderate persistent asthma    [facility]    Patient Active Problem List   Diagnosis Date Noted  . Moderate persistent asthma, uncomplicated 91/69/4503  . Seasonal and perennial allergic rhinitis 03/07/2018  . Sinus infection 02/09/2016  . Asthma with acute exacerbation 01/13/2016  . Seasonal allergic conjunctivitis 01/13/2016  . Severe persistent asthma 05/29/2015  . Allergic rhinitis 05/29/2015  . GERD (gastroesophageal reflux disease) 05/29/2015    Past Surgical History:  Procedure Laterality Date  . ADENOIDECTOMY  09/21/2011   Procedure: ADENOIDECTOMY;  Surgeon: Cecil Cranker;  Location: [facility];  Service: ENT;  Laterality: N/A;  . TYMPANOSTOMY         No family history on file.  Social History   Tobacco Use  . Smoking status: Never Smoker  . Smokeless tobacco: Never Used  Substance Use Topics  . Alcohol use: No  . Drug use: No    Home Medications Prior to Admission medications   Medication Sig Start Date End Date Taking? Authorizing Provider  albuterol (VENTOLIN HFA) 108 (90 Base)  MCG/ACT inhaler Inhale 2 puffs into the lungs every 4 (four) hours as needed for wheezing or shortness of breath. 05/07/19  Yes Valentina Shaggy, MD  diphenhydrAMINE (BENADRYL) 25 mg capsule Take 25 mg by mouth every 4 (four) hours as needed for allergies.    Yes [provider]  EPINEPHrine (EPIPEN 2-PAK) 0.3 mg/0.3 mL IJ SOAJ injection Inject 0.3 mg into the muscle once.   Yes [provider]  FLUoxetine (PROZAC) 20 MG tablet Take 1 tablet (20 mg total) by mouth daily. 12/10/19  Yes McGowen, Adrian Blackwater, MD  albuterol (PROVENTIL) (2.5 MG/3ML) 0.083% nebulizer solution Take 3 mLs (2.5 mg total) by nebulization every 6 (six) hours as needed. For shortness of breath Patient not taking: Reported on 12/15/2019 10/22/17   Jiles Prows, MD  fluticasone-salmeterol (ADVAIR HFA) [phone] MCG/ACT inhaler Inhale 2 puffs into the lungs 2 (two) times daily. Patient not taking: Reported on 12/10/2019 05/07/19   Valentina Shaggy, MD    Allergies    Molds & smuts, Peanut-containing drug products, Pollen extract, and Other  Review of Systems   Review of Systems  Constitutional:       Per HPI, otherwise negative  HENT:       Per HPI, otherwise negative  Respiratory:       Per HPI, otherwise negative  Cardiovascular:       Per HPI, otherwise negative  Gastrointestinal: Negative for vomiting.  Endocrine:  Negative aside from HPI  Genitourinary:       Neg aside from HPI   Musculoskeletal:       Per HPI, otherwise negative  Skin: Negative.   Neurological: Negative for syncope.  Psychiatric/Behavioral: Positive for suicidal ideas.    Physical Exam Updated Vital Signs BP 132/80 (BP Location: Right Arm)   Pulse 85   Temp 98.3 F (36.8 C) (Oral)   Resp 18   SpO2 98%   Physical Exam Vitals and nursing note reviewed.  Constitutional:      General: He is not in acute distress.    Appearance: He is well-developed.  HENT:     Head: Normocephalic and atraumatic.  Eyes:      Conjunctiva/sclera: Conjunctivae normal.  Pulmonary:     Effort: Pulmonary effort is normal. No respiratory distress.     Breath sounds: No stridor.  Abdominal:     General: There is no distension.  Skin:    General: Skin is warm and dry.  Neurological:     Mental Status: He is alert and oriented to person, place, and time.  Psychiatric:        Mood and Affect: Mood normal.        Thought Content: Thought content includes suicidal ideation.     ED Results / Procedures / Treatments   Labs (all labs ordered are listed, but only abnormal results are displayed) Labs Reviewed  RAPID URINE DRUG SCREEN, HOSP PERFORMED - Abnormal; Notable for the following components:      Result Value   Tetrahydrocannabinol POSITIVE (*)    All other components within normal limits  RESPIRATORY PANEL BY RT PCR (FLU A&B, COVID)  BASIC METABOLIC PANEL  ETHANOL  CBC WITH DIFFERENTIAL/PLATELET    Procedures Procedures (including critical care time)  Medications Ordered in ED Medications  albuterol (PROVENTIL) (2.5 MG/3ML) 0.083% nebulizer solution 2.5 mg (has no administration in time range)  FLUoxetine (PROZAC) capsule 20 mg (20 mg Oral Not Given 12/15/19 1644)    ED Course  I have reviewed the triage vital signs and the nursing notes.  Pertinent labs & imaging results that were available during my care of the patient were reviewed by me and considered in my medical decision making (see chart for details).  This patient presents with the need for medical evaluation due to ongoing psychiatric condition.  The patient's medical portion of the evaluation is generally reassuring, with no evidence of acute new pathology.  The patient has been medically cleared for further psychiatric evaluation.  10:23 PM Patient has been seen and evaluated by our behavioral health team.  He requests discharge. The patient's grandmother is here with brought her to the room, with nursing staff. The 4 of [place] discussed  the patient's presentation, and contracted for safety.  Patient notes that he would follow-up with his physician tomorrow and receive therapeutic options for ongoing counseling.  He is also amenable to following up at [facility] if that is not an option available tomorrow Grandmother agrees to cooperate in his care plan, contract for safety as well.  Patient has good insight into his presentation, and though he is presenting with verbal suicidal ideation, has some stigmata of ongoing depression, has capacity to contract for safety, insight are both reassuring. Final Clinical Impression(s) / ED Diagnoses Final diagnoses:  Suicidal ideation     Gerhard Munch, MD 12/15/19 2224

## 2019-12-15 NOTE — Progress Notes (Signed)
Office Note 12/15/2019  CC:  Chief Complaint  Patient presents with  . Annual Exam    pt is fasting    HPI:  Todd Garcia is a 19 y.o. White male who is here for annual health maintenance exam and for 5d  f/u recurrent severe MDD. A/P as of last visit: "MDD, recurrent, severe w/out psychosis-->in partial remission on just 1 mo of fluoxetine 20mg  qd. Decided to stay with current med dosing at this time and f/u again in 1 mo."  More psych stress: a girl likes him but she has a boyfriend and two children.  He is confused, not knowing what to do.  The other girl he had been talking to tried to commit suicide, now in mental hosp. He wishes he could have done more.   Lots of relationship problems.    Has been feeling nauseated since all this happened, some trouble breathing through mask, dizziness.   No palpitations.  Appetite getting better last 2d.  Some salad and mini corndogs. Drinking fluids fine.  Has diarrhea "constantly".  Then says he only has bm 2 times per week. Stomach shaky last few days.  He last ate 17 hrs ago, mini corn dogs. Smoking marijuana some lately has helped. Also did acid 1 wk ago, says it helps him think and get perspective and make sense of things for him.  Says he "microdoses" it.  Denies any physical abuse. His mom is currently in .  At end of interview I asked if he had suicidal or homicidal thoughts. Said no homicidal but definitely suicidal thoughts.  He said he has a plan. He says he intends to eat something he is allergic to and try to induce anaphylactic shock. I asked if he felt like doing this plan now he said "Yes".     Past Medical History:  Diagnosis Date  . Environmental allergies   . GERD (gastroesophageal reflux disease)   . Moderate persistent asthma    [facility]    Past Surgical History:  Procedure Laterality Date  . ADENOIDECTOMY  09/21/2011   Procedure: ADENOIDECTOMY;  Surgeon: 09/23/2011;   Location: [facility] OR;  Service: ENT;  Laterality: N/A;  . TYMPANOSTOMY      History reviewed. No pertinent family history.  Social History   Socioeconomic History  . Marital status: Single    Spouse name: Not on file  . Number of children: Not on file  . Years of education: Not on file  . Highest education level: Not on file  Occupational History  . Not on file  Tobacco Use  . Smoking status: Never Smoker  . Smokeless tobacco: Never Used  Substance and Sexual Activity  . Alcohol use: No  . Drug use: No  . Sexual activity: Not on file  Other Topics Concern  . Not on file  Social History Narrative   As of 10/2019 he lives in [place] with his mom and 2 half brothers.   [employer] grad.   Working a [employer] in [place].   No tob.   Occ alcohol.   Occ marijuana, occ LSD.   Social Determinants of Health   Financial Resource Strain:   . Difficulty of Paying Living Expenses:   Food Insecurity:   . Worried About [place] [employer] in the Last Year:   . Programme researcher, broadcasting/film/video in the Last Year:   Transportation Needs:   . Barista (Medical):   Freight forwarder  Lack of Transportation (Non-Medical):   Physical Activity:   . Days of Exercise per Week:   . Minutes of Exercise per Session:   Stress:   . Feeling of Stress :   Social Connections:   . Frequency of Communication with Friends and Family:   . Frequency of Social Gatherings with Friends and Family:   . Attends Religious Services:   . Active Member of Clubs or Organizations:   . Attends Banker Meetings:   [name] Marital Status:   Intimate Partner Violence:   . Fear of Current or Ex-Partner:   . Emotionally Abused:   [name] Physically Abused:   . Sexually Abused:     Outpatient Medications Prior to Visit  Medication Sig Dispense Refill  . albuterol (VENTOLIN HFA) 108 (90 Base) MCG/ACT inhaler Inhale 2 puffs into the lungs every 4 (four) hours as needed for wheezing or shortness of breath. 18 g 2  . FLUoxetine (PROZAC)  20 MG tablet Take 1 tablet (20 mg total) by mouth daily. 90 tablet 0  . albuterol (PROVENTIL) (2.5 MG/3ML) 0.083% nebulizer solution Take 3 mLs (2.5 mg total) by nebulization every 6 (six) hours as needed. For shortness of breath (Patient not taking: Reported on 12/15/2019) 75 mL 0  . diphenhydrAMINE (BENADRYL) 25 mg capsule Take 25 mg by mouth every 4 (four) hours as needed.    [name] EPINEPHrine (EPIPEN 2-PAK) 0.3 mg/0.3 mL IJ SOAJ injection Inject 0.3 mg into the muscle once.    . fluticasone-salmeterol (ADVAIR HFA) 115-21 MCG/ACT inhaler Inhale 2 puffs into the lungs 2 (two) times daily. (Patient not taking: Reported on 12/10/2019) 1 Inhaler 5   No facility-administered medications prior to visit.    Allergies  Allergen Reactions  . Molds & Smuts Shortness Of Breath    Inflames asthma, allergy shots had to be stopped   . Peanut-Containing Drug Products Anaphylaxis  . Pollen Extract Shortness Of Breath    Inflames asthma, allergy shots had to be stopped  . Other     All tree nuts    ROS: no fevers, no CP, no SOB, no wheezing, no cough, no HAs, no rashes, no melena/hematochezia.  No polyuria or polydipsia.  +Tense/tightening of stomach a lot last couple days.  No myalgias or arthralgias.  No focal weakness, paresthesias, or tremors.  No acute vision or hearing abnormalities. No vomiting.  No blood in urine.  PE; Blood pressure 116/76, pulse 70, temperature 98.4 F (36.9 C), temperature source Temporal, resp. rate 16, height 5\' 8"  (1.727 m), weight 146 lb 12.8 oz (66.6 kg), SpO2 97 %. Gen: Alert, well appearing.  Patient is oriented to person, place, time, and situation. AFFECT: pleasant, lucid thought and speech. : no injection, icteris, swelling, or exudate.  EOMI, PERRLA. Mouth: lips without lesion/swelling.  Oral mucosa pink and moist. Oropharynx without erythema, exudate, or swelling.  CV: RRR, no m/r/g.   LUNGS: CTA bilat, nonlabored resps, good aeration in all lung  fields. ABD: soft, diffusely TTP with light palpation.  No guarding or rebound. BS hypoactive.  No organomegaly or mass or bruit. EXT: no clubbing or cyanosis.  no edema.  A few dime sized light colored macules on upper chest that could be bruises (he said he had recently accidentally bumped into a wall and hit L collar bone, also carried wood up against chest w/out a shirt on.) Pertinent labs:  none  ASSESSMENT AND PLAN:   Recurrent MDD, suicidal ideation/intent. Contacted 911 and sheriff came to office  and the plan is to transport pt to [facility] for evaluation/observation. Assessment and plan was discussed with pt in detail by myself and the sheriff and pt expressed understanding and agreed with plan.  Spent 50 min with pt today, with >50% of this time spent in counseling and care coordination regarding the above problems.  An After Visit Summary was printed and given to the patient.  FOLLOW UP:  F/u to be determined based on ED/psych eval.  Signed:  Crissie Sickles, MD           12/15/2019

## 2019-12-16 ENCOUNTER — Telehealth: Payer: Self-pay

## 2019-12-16 NOTE — Telephone Encounter (Signed)
LM for pt's mom to return call

## 2019-12-16 NOTE — Telephone Encounter (Signed)
Patient's mother voiced understanding but still frustrated that no referral or recommendations made for psychiatrist. Patient was under the impression after d/c that PCP would do referral or psychiatrist from ED visit would initiate something.

## 2019-12-16 NOTE — Telephone Encounter (Signed)
Nothing we can do. He would have to appear before a judge before being committed against his will.  This is a process that would have to be initiated by a psychiatrist who is treating him. I will continue to try to help him. I understand her frustration, but there is nothing you can do if a person won't agree to change.

## 2019-12-16 NOTE — Telephone Encounter (Signed)
Mom [facility]) of patient called to speak to Dr. Milinda Cave.  Mom was verified DPR.  Patient was released from Hospital to go home yesterday. Mom stated that she has some pertinent info regarding her son's health that she would like Dr. Milinda Cave to know.  Todd Garcia can be reached at [phone]. Please have Dr. Milinda Cave call Mom.

## 2019-12-16 NOTE — Telephone Encounter (Signed)
Pt's mom contacted and would like pt to committed or in drug rehab facility. 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 She is trying her best to help him but he declines all of it. Unsure next steps or what to do.  Please advise, thanks.

## 2019-12-16 NOTE — Telephone Encounter (Signed)
Yes I understand, but he just went to the ER yesterday so things don't happen that quickly! Second of all it is not in my hands. He did see a psychiatric professional yesterday and they would be the ones to initiate whatever care they feel is appropriate. I'm sorry she's so frustrated but these are the facts.  Please communicate to her that I am still going to be his primary care provider, but his psychiatric problems are above and beyo nd what I am trained to take care of.

## 2019-12-17 NOTE — Telephone Encounter (Signed)
Patient's mother was advised of this information. Nothing further needed.

## 2019-12-18 ENCOUNTER — Telehealth: Payer: Self-pay

## 2019-12-18 DIAGNOSIS — Z8659 Personal history of other mental and behavioral disorders: Secondary | ICD-10-CM

## 2019-12-18 DIAGNOSIS — F332 Major depressive disorder, recurrent severe without psychotic features: Secondary | ICD-10-CM

## 2019-12-18 NOTE — Telephone Encounter (Signed)
Patient called and would like a referral to psychiatrist.   Please advise, thanks.

## 2019-12-18 NOTE — Telephone Encounter (Signed)
OK, [facility] referral ordered.

## 2019-12-18 NOTE — Telephone Encounter (Signed)
Todd Garcia called and wanted list or referrals to psychiatrist  and/or therapists in his network.  Dr. Milinda Cave told patient he would give a list so he call   Please call  Todd Garcia  [phone]

## 2019-12-19 NOTE — Telephone Encounter (Signed)
Patient contacted and advised he would receive a call with scheduling details regarding referral.

## 2020-01-24 DIAGNOSIS — J020 Streptococcal pharyngitis: Secondary | ICD-10-CM | POA: Diagnosis not present

## 2020-06-25 ENCOUNTER — Telehealth: Payer: Self-pay

## 2020-06-25 ENCOUNTER — Other Ambulatory Visit: Payer: Self-pay

## 2020-06-25 ENCOUNTER — Ambulatory Visit (INDEPENDENT_AMBULATORY_CARE_PROVIDER_SITE_OTHER): Payer: BC Managed Care – PPO | Admitting: Family

## 2020-06-25 ENCOUNTER — Encounter: Payer: Self-pay | Admitting: Family

## 2020-06-25 DIAGNOSIS — J3089 Other allergic rhinitis: Secondary | ICD-10-CM

## 2020-06-25 DIAGNOSIS — J4541 Moderate persistent asthma with (acute) exacerbation: Secondary | ICD-10-CM | POA: Diagnosis not present

## 2020-06-25 DIAGNOSIS — Z91010 Allergy to peanuts: Secondary | ICD-10-CM | POA: Diagnosis not present

## 2020-06-25 DIAGNOSIS — J302 Other seasonal allergic rhinitis: Secondary | ICD-10-CM

## 2020-06-25 MED ORDER — ALBUTEROL SULFATE HFA 108 (90 BASE) MCG/ACT IN AERS: 2.0000 | INHALATION_SPRAY | RESPIRATORY_TRACT | 1 refills | Status: DC | PRN

## 2020-06-25 MED ORDER — ADVAIR HFA 115-21 MCG/ACT IN AERO: INHALATION_SPRAY | RESPIRATORY_TRACT | 2 refills | Status: AC

## 2020-06-25 MED ORDER — EPINEPHRINE 0.3 MG/0.3ML IJ SOAJ: 0.3000 mg | Freq: Once | INTRAMUSCULAR | 0 refills | Status: AC

## 2020-06-25 MED ORDER — PREDNISONE 10 MG PO TABS: ORAL_TABLET | ORAL | 0 refills | Status: AC

## 2020-06-25 NOTE — Telephone Encounter (Signed)
Patient is scheduled for a teleVisit today with Todd Garcia

## 2020-06-25 NOTE — Telephone Encounter (Signed)
Forwarding this message to FNP Amada Jupiter since she is in [place] today and would have access to available samples.   However if able he should have a televisit today to determine what medications he is still on and if his regimen needs to be adjusted.

## 2020-06-25 NOTE — Patient Instructions (Addendum)
Moderate persistent asthma Start prednisone 10 mg- take 2 tablets today. Then  take 2 tablets twice a day for Saturday and Sunday, then on Monday take 2 tablets in the morning, and on Tuesday day take 1 tablet and stop. Start Advair 115/21 taking 2 puffs twice a day with spacer to help prevent cough and wheeze Start ProAir 2 puffs every 4 hours as needed for cough, wheeze, tightness in chest, shortness of breath.  Also may use albuterol 2 puffs 5 to 15 minutes prior to exercise. If your symptoms worsen of persist please go to the emergency room.  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 In case of an allergic reaction, give Benadryl 4 teaspoonfuls every 4 hours, and if life-threatening symptoms occur, inject with EpiPen 0.3 mg.  I recommend that you go get checked for COVID-19 due to your symptoms. Please let us know if this treatment plan is not working well for you. Schedule follow-up appointment in 4 weeks with spirometry

## 2020-06-25 NOTE — Progress Notes (Signed)
RE: Todd Garcia MRN: [id number] DOB: 2001/06/13 Date of Telemedicine Visit: 06/25/2020  Referring provider: Jeoffrey Massed, MD Primary care provider: Jeoffrey Massed, MD  Chief Complaint: Sinus Problem (since started yesterday ) and Asthma (sob, heavy chest )   Telemedicine Follow Up Visit via Telephone: I connected with Todd Garcia for a follow up on 06/25/20 by telephone and verified that I am speaking with the correct person using two identifiers.   I discussed the limitations, risks, security and privacy concerns of performing an evaluation and management service by telephone and the availability of in person appointments. I also discussed with the patient that there may be a patient responsible charge related to this service. The patient expressed understanding and agreed to proceed.  Patient is at home.  Provider is at the office.  Visit start time: 11:07 AM Visit end time: 11:27 AM Insurance consent/check in by: Dyasia N Medical consent and medical assistant/nurse: Irwin Brakeman  History of Present Illness: He is a 19 y.o. male, who is being followed for moderate persistent asthma, allergic rhinitis, anaphylaxis due to food, and gastroesophageal reflux disease.[name] His previous allergy office visit was on May 07, 2019 with Dr. Dellis Anes.   Moderate persistent asthma is reported as not well controlled with no medication.  He reports that he stopped Advair HFA 115/21-2 puffs twice a day with spacer approximately a year and a half ago due to feeling that it did not help with his symptoms.  He has also not had albuterol in the past year.  He reports that yesterday he started having productive cough with sputum that is green in color, tightness in his chest, shortness of breath, and some wheezing.  He denies any fevers or chills.  Allergic rhinitis is reported as not well controlled with saline rinses and Mucinex as needed.  He reports clear rhinorrhea, sore throat, nasal congestion,  and postnasal drip.  The symptoms started yesterday.  He has tried Flonase and Nasacort nasal sprays in the past and reports that they did not really help.  He is not interested in trying any nasal sprays at this time.  He continues to avoid tree nuts and peanuts and has not had any accidental ingestion.  Since his last office visit he has not required the use of his EpiPen.  Reflux is reported as controlled with no medication.  Current medications are as listed in chart. Assessment and Plan: Milan is a 19 y.o. male with: Patient Instructions  Moderate persistent asthma Start prednisone 10 mg- take 2 tablets today. Then  take 2 tablets twice a day for Saturday and Sunday, then on Monday take 2 tablets in the morning, and on Tuesday day take 1 tablet and stop. Start Advair 115/21 taking 2 puffs twice a day with spacer to help prevent cough and wheeze Start ProAir 2 puffs every 4 hours as needed for cough, wheeze, tightness in chest, shortness of breath.  Also may use albuterol 2 puffs 5 to 15 minutes prior to exercise. If your symptoms worsen of persist please go to the emergency room.  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 In case of an allergic reaction, give Benadryl 4 teaspoonfuls every 4 hours, and if life-threatening symptoms occur, inject with EpiPen 0.3 mg.  I recommend that you go get  checked for COVID-19 due to your symptoms. Please let us know if this treatment plan is not working well for you. Schedule follow-up appointment in 4 weeks with spirometry    Return in about 4 weeks (around 07/23/2020), or if symptoms worsen or fail to improve.  Meds ordered this encounter  Medications  . fluticasone-salmeterol (ADVAIR HFA) 115-21 MCG/ACT inhaler    Sig: Inhale 2 puffs  into the lungs twice a day with spacer to help prevent cough and wheeze.    Dispense:  12 g    Refill:  2  . predniSONE (DELTASONE) 10 MG tablet    Sig: Take 2 tablets (20 mg total) by mouth daily with breakfast for 1 day, THEN 2 tablets (20 mg total) 2 (two) times daily with a meal for 2 days, THEN 2 tablets (20 mg total) daily with breakfast for 1 day, THEN 1 tablet (10 mg total) daily with breakfast for 1 day.    Dispense:  13 tablet    Refill:  0  . EPINEPHrine (EPIPEN 2-PAK) 0.3 mg/0.3 mL IJ SOAJ injection    Sig: Inject 0.3 mg into the muscle once for 1 dose.    Dispense:  0.3 mL    Refill:  0  . albuterol (PROAIR HFA) 108 (90 Base) MCG/ACT inhaler    Sig: Inhale 2 puffs into the lungs every 4 (four) hours as needed for wheezing or shortness of breath.    Dispense:  1 g    Refill:  1   Lab Orders  No laboratory test(s) ordered today    Diagnostics: None.  Medication List:  Current Outpatient Medications  Medication Sig Dispense Refill  . albuterol (VENTOLIN HFA) 108 (90 Base) MCG/ACT inhaler Inhale 2 puffs into the lungs every 4 (four) hours as needed for wheezing or shortness of breath. 18 g 2  . diphenhydrAMINE (BENADRYL) 25 mg capsule Take 25 mg by mouth every 4 (four) hours as needed for allergies.     [name] EPINEPHrine (EPIPEN 2-PAK) 0.3 mg/0.3 mL IJ SOAJ injection Inject 0.3 mg into the muscle once for 1 dose. 0.3 mL 0  . FLUoxetine (PROZAC) 20 MG tablet Take 1 tablet (20 mg total) by mouth daily. 90 tablet 0  . guaiFENesin (MUCINEX) 600 MG 12 hr tablet Take 600 mg by mouth 2 (two) times daily.    [name] albuterol (PROAIR HFA) 108 (90 Base) MCG/ACT inhaler Inhale 2 puffs into the lungs every 4 (four) hours as needed for wheezing or shortness of breath. 1 g 1  . albuterol (PROVENTIL) (2.5 MG/3ML) 0.083% nebulizer solution Take 3 mLs (2.5 mg total) by nebulization every 6 (six) hours as needed. For shortness of breath (Patient not taking: Reported on 12/15/2019) 75 mL 0  .  fluticasone-salmeterol (ADVAIR HFA) 115-21 MCG/ACT inhaler Inhale 2 puffs into the lungs twice a day with spacer to help prevent cough and wheeze. 12 g 2  . predniSONE (DELTASONE) 10 MG tablet Take 2 tablets (20 mg total) by mouth daily with breakfast for 1 day, THEN 2 tablets (20 mg total) 2 (two) times daily with a meal for 2 days, THEN 2 tablets (20 mg total) daily with breakfast for 1 day, THEN 1 tablet (10 mg total) daily with breakfast for 1 day. 13 tablet 0   No current facility-administered medications for this visit.   Allergies: Allergies  Allergen Reactions  . Molds & Smuts Shortness Of Breath    Inflames asthma, allergy shots had to be stopped   .  Peanut-Containing Drug Products Anaphylaxis  . Pollen Extract Shortness Of Breath    Inflames asthma, allergy shots had to be stopped  . Other     All tree nuts  . Penicillins     Unknown cause    I reviewed his past medical history, social history, family history, and environmental history and no significant changes have been reported from previous visit on 05/07/2019.  Review of Systems  Constitutional: Negative for chills and fever.  HENT:       Reports clear rhinorrhea, sore throat, post nasal drip, and nasal congestion.  Eyes:       Denies itchy watery eyes  Respiratory: Positive for cough, chest tightness, shortness of breath and wheezing.   Gastrointestinal: Positive for diarrhea.       Reports diarrhea is normal for him. Denies reflux symptoms  Genitourinary: Negative for dysuria.  Musculoskeletal: Negative for myalgias.  Allergic/Immunologic: Positive for environmental allergies and food allergies.  Neurological: Negative for headaches.   Objective: Physical Exam Not obtained as encounter was done via telephone.   Previous notes and tests were reviewed.  I discussed the assessment and treatment plan with the patient. The patient was provided an opportunity to ask questions and all were answered. The patient  agreed with the plan and demonstrated an understanding of the instructions.   The patient was advised to call back or seek an in-person evaluation if the symptoms worsen or if the condition fails to improve as anticipated.  I provided 20 minutes of non-face-to-face time during this encounter.  It was my pleasure to participate in Tar Heel Boulier's care today. Please feel free to contact me with any questions or concerns.   Sincerely,  [name], FNP

## 2020-06-25 NOTE — Telephone Encounter (Signed)
Can you scehdule this patient as a televist with me this morning?  Thank you!

## 2020-06-25 NOTE — Telephone Encounter (Signed)
Patient called to say that he has a head cold with shortness of breath, coughing, and runny nose. He says symptoms started yesterday but has gotten worse over night. Patient wants a refill of his albuterol and Advair. Patient says that if inhalers can not be sent in can he pick up a inhaler sample in office in [place]. Patient was last seen in August of 2020 with Dr. Dellis Anes. Please advise.

## 2020-07-21 DIAGNOSIS — T781XXA Other adverse food reactions, not elsewhere classified, initial encounter: Secondary | ICD-10-CM | POA: Insufficient documentation

## 2020-07-21 NOTE — Progress Notes (Deleted)
Follow Up Note  RE: Todd Garcia MRN: [id number] DOB: 11-15-2000 Date of Office Visit: 07/22/2020  Referring provider: Jeoffrey Massed, MD Primary care provider: Jeoffrey Massed, MD  Chief Complaint: No chief complaint on file.  History of Present Illness: I had the pleasure of seeing Todd Garcia for a follow up visit at the [facility] on 07/21/2020. He is a 19 y.o. male, who is being followed for asthma, allergic rhinitis, reflux and food allergy. His previous allergy office visit was on 06/25/2020 with [name] FNP via telemedicine. Today is a regular follow up visit.  2017 skin testing was positive to grass, weed, ragweed, trees, mold, cat, dog, horse, mouse, dust mite.  2017 skin testing positive to peanuts, tree nuts and coconut.  Moderate persistent asthma Start prednisone 10 mg- take 2 tablets today. Then  take 2 tablets twice a day for Saturday and Sunday, then on Monday take 2 tablets in the morning, and on Tuesday day take 1 tablet and stop. Start Advair 115/21 taking 2 puffs twice a day with spacer to help prevent cough and wheeze Start ProAir 2 puffs every 4 hours as needed for cough, wheeze, tightness in chest, shortness of breath.  Also may use albuterol 2 puffs 5 to 15 minutes prior to exercise. If your symptoms worsen of persist please go to the emergency room.  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 In case of an allergic reaction, give Benadryl 4 teaspoonfuls every 4 hours, and if life-threatening symptoms occur, inject with EpiPen 0.3 mg.  I recommend that you go get checked for COVID-19 due to your symptoms. Please let us know if this treatment plan is not working well for you. Schedule follow-up appointment in 4 weeks  with spirometry  Assessment and Plan: Todd Garcia is a 19 y.o. male with: No problem-specific Assessment & Plan notes found for this encounter.  No follow-ups on file.  No orders of the defined types were placed in this encounter.  Lab Orders  No laboratory test(s) ordered today    Diagnostics: Spirometry:  Tracings reviewed. His effort: {Blank single:19197::"Good reproducible efforts.","It was hard to get consistent efforts and there is a question as to whether this reflects a maximal maneuver.","Poor effort, data can not be interpreted."} FVC: ***L FEV1: ***L, ***% predicted FEV1/FVC ratio: ***% Interpretation: {Blank single:19197::"Spirometry consistent with mild obstructive disease","Spirometry consistent with moderate obstructive disease","Spirometry consistent with severe obstructive disease","Spirometry consistent with possible restrictive disease","Spirometry consistent with mixed obstructive and restrictive disease","Spirometry uninterpretable due to technique","Spirometry consistent with normal pattern","No overt abnormalities noted given today's efforts"}.  Please see scanned spirometry results for details.  Skin Testing: {Blank single:19197::"Select foods","Environmental allergy panel","Environmental allergy panel and select foods","Food allergy panel","None","Deferred due to recent antihistamines use"}. Positive test to: ***. Negative test to: ***.  Results discussed with patient/family.   Medication List:  Current Outpatient Medications  Medication Sig Dispense Refill  . albuterol (PROAIR HFA) 108 (90 Base) MCG/ACT inhaler Inhale 2 puffs into the lungs every 4 (four) hours as needed for wheezing or shortness of breath. 1 g 1  . albuterol (PROVENTIL) (2.5 MG/3ML) 0.083% nebulizer solution Take 3 mLs (2.5 mg total) by nebulization every 6 (six) hours as needed. For shortness of breath (Patient not taking: Reported on 12/15/2019) 75 mL 0  .  albuterol (VENTOLIN HFA) 108 (90 Base)  MCG/ACT inhaler Inhale 2 puffs into the lungs every 4 (four) hours as needed for wheezing or shortness of breath. 18 g 2  . diphenhydrAMINE (BENADRYL) 25 mg capsule Take 25 mg by mouth every 4 (four) hours as needed for allergies.     [name] FLUoxetine (PROZAC) 20 MG tablet Take 1 tablet (20 mg total) by mouth daily. 90 tablet 0  . fluticasone-salmeterol (ADVAIR HFA) 115-21 MCG/ACT inhaler Inhale 2 puffs into the lungs twice a day with spacer to help prevent cough and wheeze. 12 g 2  . guaiFENesin (MUCINEX) 600 MG 12 hr tablet Take 600 mg by mouth 2 (two) times daily.     No current facility-administered medications for this visit.   Allergies: Allergies  Allergen Reactions  . Molds & Smuts Shortness Of Breath    Inflames asthma, allergy shots had to be stopped   . Peanut-Containing Drug Products Anaphylaxis  . Pollen Extract Shortness Of Breath    Inflames asthma, allergy shots had to be stopped  . Other     All tree nuts  . Penicillins     Unknown cause    I reviewed his past medical history, social history, family history, and environmental history and no significant changes have been reported from his previous visit.  Review of Systems  Constitutional: Negative for appetite change, chills, fever and unexpected weight change.  HENT: Negative for congestion and rhinorrhea.   Eyes: Negative for itching.  Respiratory: Negative for cough, chest tightness, shortness of breath and wheezing.   Gastrointestinal: Negative for abdominal pain.  Skin: Negative for rash.  Allergic/Immunologic: Positive for environmental allergies and food allergies.  Neurological: Negative for headaches.   Objective: There were no vitals taken for this visit. There is no height or weight on file to calculate BMI. Physical Exam Vitals and nursing note reviewed.  Constitutional:      Appearance: Normal appearance. He is well-developed.  HENT:     Head: Normocephalic and atraumatic.     Right Ear: External  ear normal.     Left Ear: External ear normal.     Nose: Nose normal.     Mouth/Throat:     Mouth: Mucous membranes are moist.     Pharynx: Oropharynx is clear.  Eyes:     Conjunctiva/sclera: Conjunctivae normal.  Cardiovascular:     Rate and Rhythm: Normal rate and regular rhythm.     Heart sounds: Normal heart sounds. No murmur heard.   Pulmonary:     Effort: Pulmonary effort is normal.     Breath sounds: Normal breath sounds. No wheezing, rhonchi or rales.  Musculoskeletal:     Cervical back: Neck supple.  Skin:    General: Skin is warm.     Findings: No rash.  Neurological:     Mental Status: He is alert and oriented to person, place, and time.  Psychiatric:        Behavior: Behavior normal.    Previous notes and tests were reviewed. The plan was reviewed with the patient/family, and all questions/concerned were addressed.  It was my pleasure to see Wrigley today and participate in his care. Please feel free to contact me with any questions or concerns.  Sincerely,  Wyline Mood, DO Allergy & Immunology  [facility] office: [phone] [facility] office: [phone]

## 2020-07-22 ENCOUNTER — Ambulatory Visit: Payer: Self-pay | Admitting: Allergy

## 2020-07-22 DIAGNOSIS — J302 Other seasonal allergic rhinitis: Secondary | ICD-10-CM

## 2020-07-22 DIAGNOSIS — J4540 Moderate persistent asthma, uncomplicated: Secondary | ICD-10-CM

## 2020-07-22 DIAGNOSIS — T781XXD Other adverse food reactions, not elsewhere classified, subsequent encounter: Secondary | ICD-10-CM

## 2020-09-24 DIAGNOSIS — R059 Cough, unspecified: Secondary | ICD-10-CM | POA: Diagnosis not present

## 2020-09-24 DIAGNOSIS — J00 Acute nasopharyngitis [common cold]: Secondary | ICD-10-CM | POA: Diagnosis not present

## 2020-09-24 DIAGNOSIS — Z20822 Contact with and (suspected) exposure to covid-19: Secondary | ICD-10-CM | POA: Diagnosis not present

## 2020-11-18 ENCOUNTER — Encounter: Payer: Self-pay | Admitting: Physician Assistant

## 2020-11-18 ENCOUNTER — Ambulatory Visit: Payer: Self-pay

## 2020-11-18 ENCOUNTER — Ambulatory Visit (INDEPENDENT_AMBULATORY_CARE_PROVIDER_SITE_OTHER): Payer: BC Managed Care – PPO | Admitting: Physician Assistant

## 2020-11-18 DIAGNOSIS — S8002XA Contusion of left knee, initial encounter: Secondary | ICD-10-CM

## 2020-11-18 DIAGNOSIS — M25562 Pain in left knee: Secondary | ICD-10-CM | POA: Diagnosis not present

## 2020-11-18 NOTE — Progress Notes (Signed)
Office Visit Note   Patient: Todd Garcia           Date of Birth: 10-Feb-2001           MRN: [id number] Visit Date: 11/18/2020              Requested by: Jeoffrey Massed, MD [address] [place],  [place] [address] PCP: Jeoffrey Massed, MD   Assessment & Plan: Visit Diagnoses:  1. Acute pain of left knee   2. Contusion of left knee, initial encounter     Plan: We will place in an hinged knee brace.  Recommend heat and ice interchanging.  Also will have him apply Voltaren gel 4 g 4 times daily over the area of maximal tenderness.  We will see him back in 2 weeks to see how he is doing is to be mindful of any mechanical symptoms which are discussed with him.  Did place him out of work until this coming Monday, February 28.  Questions were encouraged and answered.  Follow-Up Instructions: Return in about 2 weeks (around 12/02/2020).   Orders:  Orders Placed This Encounter  Procedures  . XR KNEE 3 VIEW LEFT   No orders of the defined types were placed in this encounter.     Procedures: No procedures performed   Clinical Data: No additional findings.   Subjective: Chief Complaint  Patient presents with  . Left Knee - Pain    HPI Todd Garcia is a 20 year old male who comes in today due to left knee injury that occurred on the job.  He was pulling a hand truck and slipped on wet floor fell backwards with a hand truck that he was carrying several boxes of lettuce and some bags of the onions on.  The hand truck fell backwards on him and landed on him.  Most of the way of the hand truck did fall on his legs particularly the left knee.  He has been able to bear weight but is having significant pain in his left knee with weightbearing.  He has tried heat has not taken anything for pain at this point.  Review of Systems See HPI otherwise negative or noncontributory.  Objective: Vital Signs: There were no vitals taken for this visit.  Physical Exam Constitutional:       Appearance: He is not ill-appearing or diaphoretic.  Pulmonary:     Effort: Pulmonary effort is normal.  Neurological:     Mental Status: He is alert and oriented to person, place, and time.  Psychiatric:        Mood and Affect: Mood normal.     Ortho Exam Bilateral knees full extension.  Full flexion right knee.  Limited left knee lection secondary to pain.  He is able to do straight leg raise bilaterally but has pain when performing this maneuver on the left.  Seated position is able to extend the knee on the left against gravity but has pain.  He is nontender over the patellofemoral tendon and over the patellar tibial tendon.  Maximal tenderness over the VMO region.  Significant amount of ecchymosis in this area.  Nontender over the medial lateral joint line left knee.  No effusion abnormal warmth erythema left knee.  No rashes skin lesions ulcerations left knee.  No instability valgus varus stressing of either knee. Specialty Comments:  No specialty comments available.  Imaging: XR KNEE 3 VIEW LEFT  Result Date: 11/18/2020 Left knee AP lateral and sunrise  views: No acute fractures no bony abnormalities.  Knee is well located.    PMFS History: Patient Active Problem List   Diagnosis Date Noted  . Adverse food reaction 07/21/2020  . Moderate persistent asthma, uncomplicated 03/07/2018  . Seasonal and perennial allergic rhinitis 03/07/2018  . Sinus infection 02/09/2016  . Asthma with acute exacerbation 01/13/2016  . Seasonal allergic conjunctivitis 01/13/2016  . Severe persistent asthma 05/29/2015  . Allergic rhinitis 05/29/2015  . GERD (gastroesophageal reflux disease) 05/29/2015   Past Medical History:  Diagnosis Date  . Environmental allergies   . GERD (gastroesophageal reflux disease)   . Moderate persistent asthma    [facility]    History reviewed. No pertinent family history.  Past Surgical History:  Procedure Laterality Date  . ADENOIDECTOMY   09/21/2011   Procedure: ADENOIDECTOMY;  Surgeon: Leonette Most;  Location: [facility] OR;  Service: ENT;  Laterality: N/A;  . TYMPANOSTOMY     Social History   Occupational History  . Not on file  Tobacco Use  . Smoking status: Never Smoker  . Smokeless tobacco: Never Used  Vaping Use  . Vaping Use: Never used  Substance and Sexual Activity  . Alcohol use: No  . Drug use: No  . Sexual activity: Not on file

## 2020-12-02 ENCOUNTER — Encounter: Payer: Self-pay | Admitting: Physician Assistant

## 2020-12-02 ENCOUNTER — Ambulatory Visit (INDEPENDENT_AMBULATORY_CARE_PROVIDER_SITE_OTHER): Payer: BC Managed Care – PPO | Admitting: Physician Assistant

## 2020-12-02 DIAGNOSIS — M25562 Pain in left knee: Secondary | ICD-10-CM

## 2020-12-02 NOTE — Progress Notes (Signed)
    HPI: Todd Garcia returns today for follow-up of his left knee.  He states he is having no pain in the knee no mechanical symptoms.  Is not taking anything for pain at this point.  He is back at work full duties.  Physical exam: Left knee full range of motion no abnormal warmth erythema or effusion no instability valgus varus stressing.  Anterior drawer is negative.  Plan: He will follow up with [place] as needed.  He mainly came in today to speak in the building take to his employer for the last visit.  No charge for today's office visit

## 2021-03-30 DIAGNOSIS — R21 Rash and other nonspecific skin eruption: Secondary | ICD-10-CM | POA: Diagnosis not present

## 2021-08-15 ENCOUNTER — Other Ambulatory Visit: Payer: Self-pay | Admitting: Family

## 2021-08-15 NOTE — Telephone Encounter (Signed)
It has been over a year since Todd Garcia was last seen. Ok to send refill,but he must schedule a follow up appointment for further refills.

## 2021-10-27 ENCOUNTER — Other Ambulatory Visit: Payer: Self-pay

## 2021-10-27 ENCOUNTER — Encounter: Payer: Self-pay | Admitting: Allergy

## 2021-10-27 ENCOUNTER — Ambulatory Visit (INDEPENDENT_AMBULATORY_CARE_PROVIDER_SITE_OTHER): Payer: BC Managed Care – PPO | Admitting: Allergy

## 2021-10-27 VITALS — BP 110/70 | HR 78 | Temp 98.0°F | Resp 18 | Ht 68.0 in | Wt 144.0 lb

## 2021-10-27 DIAGNOSIS — J4530 Mild persistent asthma, uncomplicated: Secondary | ICD-10-CM | POA: Insufficient documentation

## 2021-10-27 DIAGNOSIS — T7800XA Anaphylactic reaction due to unspecified food, initial encounter: Secondary | ICD-10-CM | POA: Insufficient documentation

## 2021-10-27 DIAGNOSIS — J3089 Other allergic rhinitis: Secondary | ICD-10-CM

## 2021-10-27 DIAGNOSIS — J302 Other seasonal allergic rhinitis: Secondary | ICD-10-CM

## 2021-10-27 DIAGNOSIS — T7800XD Anaphylactic reaction due to unspecified food, subsequent encounter: Secondary | ICD-10-CM

## 2021-10-27 DIAGNOSIS — Z720 Tobacco use: Secondary | ICD-10-CM | POA: Diagnosis not present

## 2021-10-27 MED ORDER — ALBUTEROL SULFATE HFA 108 (90 BASE) MCG/ACT IN AERS: 2.0000 | INHALATION_SPRAY | RESPIRATORY_TRACT | 1 refills | Status: DC | PRN

## 2021-10-27 NOTE — Assessment & Plan Note (Addendum)
Past history - 2017 skin testing was positive to grass, weed, ragweed, trees, mold, cat, dog, horse, mouse, dust mites. Interim history - asymptomatic with no daily meds.  Continue environmental control measures.  Use over the counter antihistamines such as Zyrtec (cetirizine), Claritin (loratadine), Allegra (fexofenadine), or Xyzal (levocetirizine) daily as needed. May take twice a day during allergy flares. May switch antihistamines every few months.

## 2021-10-27 NOTE — Patient Instructions (Addendum)
Asthma:  Daily controller medication(s): none. During upper respiratory infections/asthma flares:  Start Advair 2 puffs twice a day with spacer and rinse mouth afterwards for 1-2 weeks until your breathing symptoms return to baseline.  Pretreat with albuterol 2 puffs.  May use albuterol rescue inhaler 2 puffs every 4 to 6 hours as needed for shortness of breath, chest tightness, coughing, and wheezing. May use albuterol rescue inhaler 2 puffs 5 to 15 minutes prior to strenuous physical activities. Monitor frequency of use.  Asthma control goals:  Full participation in all desired activities (may need albuterol before activity) Albuterol use two times or less a week on average (not counting use with activity) Cough interfering with sleep two times or less a month Oral steroids no more than once a year No hospitalizations   Food allergy Continue strict avoidance of peanuts, tree nuts, coconut. For mild symptoms you can take over the counter antihistamines such as Benadryl and monitor symptoms closely. If symptoms worsen or if you have severe symptoms including breathing issues, throat closure, significant swelling, whole body hives, severe diarrhea and vomiting, lightheadedness then inject epinephrine and seek immediate medical care afterwards. Action plan updated.  Environmental allergies  2017 skin testing was positive to grass, weed, ragweed, trees, mold, cat, dog, horse, mouse, dust mites. Continue environmental control measures. Use over the counter antihistamines such as Zyrtec (cetirizine), Claritin (loratadine), Allegra (fexofenadine), or Xyzal (levocetirizine) daily as needed. May take twice a day during allergy flares. May switch antihistamines every few months.  Follow up in 6 months or sooner if needed.   Reducing Pollen Exposure Pollen seasons: trees (spring), grass (summer) and ragweed/weeds (fall). Keep windows closed in your home and car to lower pollen exposure.   Install air conditioning in the bedroom and throughout the house if possible.  Avoid going out in dry windy days - especially early morning. Pollen counts are highest between 5 - 10 AM and on dry, hot and windy days.  Save outside activities for late afternoon or after a heavy rain, when pollen levels are lower.  Avoid mowing of grass if you have grass pollen allergy. Be aware that pollen can also be transported indoors on people and pets.  Dry your clothes in an automatic dryer rather than hanging them outside where they might collect pollen.  Rinse hair and eyes before bedtime. Mold Control Mold and fungi can grow on a variety of surfaces provided certain temperature and moisture conditions exist.  Outdoor molds grow on plants, decaying vegetation and soil. The major outdoor mold, Alternaria and Cladosporium, are found in very high numbers during hot and dry conditions. Generally, a late summer - fall peak is seen for common outdoor fungal spores. Rain will temporarily lower outdoor mold spore count, but counts rise rapidly when the rainy period ends. The most important indoor molds are Aspergillus and Penicillium. Dark, humid and poorly ventilated basements are ideal sites for mold growth. The next most common sites of mold growth are the bathroom and the kitchen. Outdoor (Seasonal) Mold Control Use air conditioning and keep windows closed. Avoid exposure to decaying vegetation. Avoid leaf raking. Avoid grain handling. Consider wearing a face mask if working in moldy areas.  Indoor (Perennial) Mold Control  Maintain humidity below 50%. Get rid of mold growth on hard surfaces with water, detergent and, if necessary, 5% bleach (do not mix with other cleaners). Then dry the area completely. If mold covers an area more than 10 square feet, consider hiring an indoor  environmental professional. For clothing, washing with soap and water is best. If moldy items cannot be cleaned and dried, throw  them away. Remove sources e.g. contaminated carpets. Repair and seal leaking roofs or pipes. Using dehumidifiers in damp basements may be helpful, but empty the water and clean units regularly to prevent mildew from forming. All rooms, especially basements, bathrooms and kitchens, require ventilation and cleaning to deter mold and mildew growth. Avoid carpeting on concrete or damp floors, and storing items in damp areas. Control of House Dust Mite Allergen Dust mite allergens are a common trigger of allergy and asthma symptoms. While they can be found throughout the house, these microscopic creatures thrive in warm, humid environments such as bedding, upholstered furniture and carpeting. Because so much time is spent in the bedroom, it is essential to reduce mite levels there.  Encase pillows, mattresses, and box springs in special allergen-proof fabric covers or airtight, zippered plastic covers.  Bedding should be washed weekly in hot water (130 F) and dried in a hot dryer. Allergen-proof covers are available for comforters and pillows that cant be regularly washed.  Wash the allergy-proof covers every few months. Minimize clutter in the bedroom. Keep pets out of the bedroom.  Keep humidity less than 50% by using a dehumidifier or air conditioning. You can buy a humidity measuring device called a hygrometer to monitor this.  If possible, replace carpets with hardwood, linoleum, or washable area rugs. If that's not possible, vacuum frequently with a vacuum that has a HEPA filter. Remove all upholstered furniture and non-washable window drapes from the bedroom. Remove all non-washable stuffed toys from the bedroom.  Wash stuffed toys weekly. Pet Allergen Avoidance: Contrary to popular opinion, there are no hypoallergenic breeds of dogs or cats. That is because people are not allergic to an animals hair, but to an allergen found in the animal's saliva, dander (dead skin flakes) or urine. Pet  allergy symptoms typically occur within minutes. For some people, symptoms can build up and become most severe 8 to 12 hours after contact with the animal. People with severe allergies can experience reactions in public places if dander has been transported on the pet owners clothing. Keeping an animal outdoors is only a partial solution, since homes with pets in the yard still have higher concentrations of animal allergens. Before getting a pet, ask your allergist to determine if you are allergic to animals. If your pet is already considered part of your family, try to minimize contact and keep the pet out of the bedroom and other rooms where you spend a great deal of time. As with dust mites, vacuum carpets often or replace carpet with a hardwood floor, tile or linoleum. High-efficiency particulate air (HEPA) cleaners can reduce allergen levels over time. While dander and saliva are the source of cat and dog allergens, urine is the source of allergens from rabbits, hamsters, mice and [place] pigs; so ask a non-allergic family member to clean the animals cage. If you have a pet allergy, talk to your allergist about the potential for allergy immunotherapy (allergy shots). This strategy can often provide long-term relief.

## 2021-10-27 NOTE — Progress Notes (Signed)
Follow Up Note  RE: [name] MRN: [id number] DOB: 28-Nov-2000 Date of Office Visit: 10/27/2021  Referring provider: Jeoffrey Massed, MD Primary care provider: Jeoffrey Massed, MD  Chief Complaint: Asthma  History of Present Illness: I had the pleasure of seeing Todd Garcia for a follow up visit at the [facility] on 10/27/2021. He is a 21 y.o. male, who is being followed for asthma, allergic rhinitis, reflux and food allergy. His previous allergy office visit was on 06/25/2020 with [name] FNP via telemedicine. Today is a regular follow up visit.  Moderate persistent asthma Only uses albuterol during URIs or infections with good benefit. Only using albuterol 2-3 times per year with good benefit.  Denies any SOB, coughing, wheezing, chest tightness, nocturnal awakenings, ER/urgent care visits or prednisone use since the last visit.  Patient stopped Advair 2 years ago with no flare in symptoms. Still has Advair at home. Needs ventolin refill.  Currently vapes daily.   Allergic rhinitis Occasionally it flares up with coughing and rhinorrhea. Only takes Mucinex as needed.   Reflux Resolved and not taking any medications.  Improved with weight loss - lost about 100lbs in the past 4-5 months which was intentional with dietary changes and increased exercise.    Food allergy Avoiding peanuts, tree nuts and coconut with no reactions.   Assessment and Plan: [name] is a 21 y.o. male with: Mild persistent asthma without complication Flares with URIs and infections. Feels some chest tightness today. Vapes daily. Only uses albuterol 2-3 times per year with good benefit. Stopped daily Advair 2 years ago with no worsening symptoms.  Today's spirometry showed some mild obstruction with 23% improvement in FEV1 post bronchodilator treatment.  Clinically feeling improved. Patient seems to be an under-perceiver of his symptoms.  Daily controller medication(s):  none. During upper respiratory infections/asthma flares:  Start Advair 2 puffs twice a day with spacer and rinse mouth afterwards for 1-2 weeks until your breathing symptoms return to baseline.  Pretreat with albuterol 2 puffs.  May use albuterol rescue inhaler 2 puffs every 4 to 6 hours as needed for shortness of breath, chest tightness, coughing, and wheezing. May use albuterol rescue inhaler 2 puffs 5 to 15 minutes prior to strenuous physical activities. Monitor frequency of use.  Get spirometry at next visit. Discussed vaping cessation.  Seasonal and perennial allergic rhinitis Past history - 2017 skin testing was positive to grass, weed, ragweed, trees, mold, cat, dog, horse, mouse, dust mites. Interim history - asymptomatic with no daily meds. Continue environmental control measures. Use over the counter antihistamines such as Zyrtec (cetirizine), Claritin (loratadine), Allegra (fexofenadine), or Xyzal (levocetirizine) daily as needed. May take twice a day during allergy flares. May switch antihistamines every few months.  Anaphylactic shock due to adverse food reaction Past history - 2017 skin testing was positive to peanuts, tree nuts and coconut. Interim history - no reactions. Continue strict avoidance of peanuts, tree nuts and coconut. For mild symptoms you can take over the counter antihistamines such as Benadryl and monitor symptoms closely. If symptoms worsen or if you have severe symptoms including breathing issues, throat closure, significant swelling, whole body hives, severe diarrhea and vomiting, lightheadedness then inject epinephrine and seek immediate medical care afterwards. Action plan updated.   Return in about 6 months (around 04/26/2022).  Meds ordered this encounter  Medications   albuterol (VENTOLIN HFA) 108 (90 Base) MCG/ACT inhaler    Sig: Inhale 2 puffs into  the lungs every 4 (four) hours as needed for wheezing or shortness of breath (coughing fits).     Dispense:  18 g    Refill:  1   Lab Orders  No laboratory test(s) ordered today    Diagnostics: Spirometry:  Tracings reviewed. His effort: Good reproducible efforts. FVC: 5.22L FEV1: 3.47L, 79% predicted FEV1/FVC ratio: 66% Interpretation: Spirometry consistent with mild obstructive disease with 23% improvement in FEV1 post bronchodilator treatment. Clinically feeling improving.   Please see scanned spirometry results for details.   Medication List:  Current Outpatient Medications  Medication Sig Dispense Refill   albuterol (VENTOLIN HFA) 108 (90 Base) MCG/ACT inhaler Inhale 2 puffs into the lungs every 4 (four) hours as needed for wheezing or shortness of breath (coughing fits). 18 g 1   diphenhydrAMINE (BENADRYL) 25 mg capsule Take 25 mg by mouth every 4 (four) hours as needed for allergies.  (Patient not taking: Reported on 10/27/2021)     FLUoxetine (PROZAC) 20 MG tablet Take 1 tablet (20 mg total) by mouth daily. (Patient not taking: Reported on 10/27/2021) 90 tablet 0   fluticasone-salmeterol (ADVAIR HFA) 115-21 MCG/ACT inhaler Inhale 2 puffs into the lungs twice a day with spacer to help prevent cough and wheeze. (Patient not taking: Reported on 10/27/2021) 12 g 2   No current facility-administered medications for this visit.   Allergies: Allergies  Allergen Reactions   Molds & Smuts Shortness Of Breath    Inflames asthma, allergy shots had to be stopped    Peanut-Containing Drug Products Anaphylaxis   Pollen Extract Shortness Of Breath    Inflames asthma, allergy shots had to be stopped   Other     All tree nuts   Penicillins     Unknown cause    I reviewed his past medical history, social history, family history, and environmental history and no significant changes have been reported from his previous visit.  Review of Systems  Constitutional:  Negative for appetite change, chills, fever and unexpected weight change.  HENT:  Negative for congestion and rhinorrhea.    Eyes:  Negative for itching.  Respiratory:  Positive for cough and chest tightness. Negative for shortness of breath and wheezing.   Gastrointestinal:  Negative for abdominal pain.  Skin:  Negative for rash.  Allergic/Immunologic: Positive for environmental allergies and food allergies.  Neurological:  Negative for headaches.   Objective: BP 110/70    Pulse 78    Temp 98 F (36.7 C) (Temporal)    Resp 18    Ht 5\' 8"  (1.727 m)    Wt 144 lb (65.3 kg)    SpO2 98%    BMI 21.90 kg/m  Body mass index is 21.9 kg/m. Physical Exam Vitals and nursing note reviewed.  Constitutional:      Appearance: Normal appearance. He is well-developed.  HENT:     Head: Normocephalic and atraumatic.     Right Ear: Tympanic membrane and external ear normal.     Left Ear: Tympanic membrane and external ear normal.     Nose: Nose normal.     Mouth/Throat:     Mouth: Mucous membranes are moist.     Pharynx: Oropharynx is clear.  Eyes:     Conjunctiva/sclera: Conjunctivae normal.  Cardiovascular:     Rate and Rhythm: Normal rate and regular rhythm.     Heart sounds: Normal heart sounds. No murmur heard. Pulmonary:     Effort: Pulmonary effort is normal.     Breath sounds: Normal  breath sounds. No wheezing, rhonchi or rales.  Musculoskeletal:     Cervical back: Neck supple.  Skin:    General: Skin is warm.     Findings: No rash.  Neurological:     Mental Status: He is alert and oriented to person, place, and time.  Psychiatric:        Behavior: Behavior normal.  Previous notes and tests were reviewed. The plan was reviewed with the patient/family, and all questions/concerned were addressed.  It was my pleasure to see Mcguire today and participate in his care. Please feel free to contact me with any questions or concerns.  Sincerely,  Wyline Mood, DO Allergy & Immunology  [facility] office: [phone] [facility] office: [phone]

## 2021-10-27 NOTE — Assessment & Plan Note (Signed)
Past history - 2017 skin testing was positive to peanuts, tree nuts and coconut. Interim history - no reactions.  Continue strict avoidance of peanuts, tree nuts and coconut.  For mild symptoms you can take over the counter antihistamines such as Benadryl and monitor symptoms closely. If symptoms worsen or if you have severe symptoms including breathing issues, throat closure, significant swelling, whole body hives, severe diarrhea and vomiting, lightheadedness then inject epinephrine and seek immediate medical care afterwards.  Action plan updated.

## 2021-10-27 NOTE — Assessment & Plan Note (Deleted)
Past history - 2017 skin testing was positive to peanuts, tree nuts and coconut. °Interim history - no reactions. °· Continue strict avoidance of peanuts, tree nuts and coconut. °· For mild symptoms you can take over the counter antihistamines such as Benadryl and monitor symptoms closely. If symptoms worsen or if you have severe symptoms including breathing issues, throat closure, significant swelling, whole body hives, severe diarrhea and vomiting, lightheadedness then inject epinephrine and seek immediate medical care afterwards. °· Action plan updated.  °

## 2021-10-27 NOTE — Assessment & Plan Note (Addendum)
Flares with URIs and infections. Feels some chest tightness today. Vapes daily. Only uses albuterol 2-3 times per year with good benefit. Stopped daily Advair 2 years ago with no worsening symptoms.   Today's spirometry showed some mild obstruction with 23% improvement in FEV1 post bronchodilator treatment.  Clinically feeling improved.  Patient seems to be an under-perceiver of his symptoms.   Daily controller medication(s): none.  During upper respiratory infections/asthma flares:  o Start Advair 2 puffs twice a day with spacer and rinse mouth afterwards for 1-2 weeks until your breathing symptoms return to baseline.  o Pretreat with albuterol 2 puffs.   May use albuterol rescue inhaler 2 puffs every 4 to 6 hours as needed for shortness of breath, chest tightness, coughing, and wheezing. May use albuterol rescue inhaler 2 puffs 5 to 15 minutes prior to strenuous physical activities. Monitor frequency of use.   Get spirometry at next visit.  Discussed vaping cessation.

## 2022-05-11 ENCOUNTER — Other Ambulatory Visit: Payer: Self-pay

## 2022-05-11 MED ORDER — ALBUTEROL SULFATE HFA 108 (90 BASE) MCG/ACT IN AERS: 2.0000 | INHALATION_SPRAY | RESPIRATORY_TRACT | 1 refills | Status: DC | PRN

## 2022-08-31 ENCOUNTER — Ambulatory Visit: Payer: BC Managed Care – PPO | Admitting: Family Medicine

## 2022-08-31 ENCOUNTER — Encounter: Payer: Self-pay | Admitting: Family Medicine

## 2022-08-31 NOTE — Progress Notes (Deleted)
OFFICE VISIT  08/31/2022  CC: No chief complaint on file.   Patient is a 21 y.o. male who presents for ***  HPI: ***  Past Medical History:  Diagnosis Date   Environmental allergies    GERD (gastroesophageal reflux disease)    Moderate persistent asthma    [facility]    Past Surgical History:  Procedure Laterality Date   ADENOIDECTOMY  09/21/2011   Procedure: ADENOIDECTOMY;  Surgeon: Leonette Most;  Location: [facility] OR;  Service: ENT;  Laterality: N/A;   TYMPANOSTOMY      Outpatient Medications Prior to Visit  Medication Sig Dispense Refill   albuterol (VENTOLIN HFA) 108 (90 Base) MCG/ACT inhaler Inhale 2 puffs into the lungs every 4 (four) hours as needed for wheezing or shortness of breath (coughing fits). 18 g 1   diphenhydrAMINE (BENADRYL) 25 mg capsule Take 25 mg by mouth every 4 (four) hours as needed for allergies.  (Patient not taking: Reported on 10/27/2021)     FLUoxetine (PROZAC) 20 MG tablet Take 1 tablet (20 mg total) by mouth daily. (Patient not taking: Reported on 10/27/2021) 90 tablet 0   fluticasone-salmeterol (ADVAIR HFA) 115-21 MCG/ACT inhaler Inhale 2 puffs into the lungs twice a day with spacer to help prevent cough and wheeze. (Patient not taking: Reported on 10/27/2021) 12 g 2   No facility-administered medications prior to visit.    Allergies  Allergen Reactions   Molds & Smuts Shortness Of Breath    Inflames asthma, allergy shots had to be stopped    Peanut-Containing Drug Products Anaphylaxis   Pollen Extract Shortness Of Breath    Inflames asthma, allergy shots had to be stopped   Other     All tree nuts   Penicillins     Unknown cause     ROS As per HPI  PE:    10/27/2021   11:56 AM 12/15/2019    6:08 PM 12/15/2019    2:57 PM  Vitals with BMI  Height 5\' 8"     Weight 144 lbs    BMI 21.9    Systolic 110 132  Diastolic 70 80 99  Pulse 78 85 70     Physical Exam  ***  LABS:  none  IMPRESSION AND PLAN:  No  problem-specific Assessment & Plan notes found for this encounter.   An After Visit Summary was printed and given to the patient.  FOLLOW UP: No follow-ups on file.  Signed:  426, MD           08/31/2022

## 2022-09-20 ENCOUNTER — Encounter: Payer: Self-pay | Admitting: Family Medicine

## 2022-11-22 ENCOUNTER — Other Ambulatory Visit: Payer: Self-pay | Admitting: Allergy

## 2022-12-07 ENCOUNTER — Other Ambulatory Visit: Payer: Self-pay

## 2022-12-07 MED ORDER — ALBUTEROL SULFATE HFA 108 (90 BASE) MCG/ACT IN AERS: 2.0000 | INHALATION_SPRAY | RESPIRATORY_TRACT | 1 refills | Status: AC | PRN

## 2022-12-07 NOTE — Telephone Encounter (Signed)
Patient called needing a refill on albuterol. I sent in a refill to the [facility] in [place].  Todd Garcia [phone]

## 2023-05-02 ENCOUNTER — Ambulatory Visit: Payer: BC Managed Care – PPO | Admitting: Allergy

## 2024-10-06 ENCOUNTER — Telehealth: Payer: Self-pay | Admitting: Allergy

## 2024-10-06 NOTE — Telephone Encounter (Signed)
 PT called for Rx refill - I advised last appt was 10/2021 and would need a new OV to get refills.  PT acknowledged but stated would call back later to schedule an appointment, had called just to see if we could do Rx
